# Patient Record
Sex: Male | Born: 1935 | Race: White | Hispanic: No | Marital: Married | Smoking: Never smoker
Health system: Southern US, Community
[De-identification: ages and names within clinical notes are randomized; demographics above are authoritative.]

## PROBLEM LIST (undated history)

## (undated) DIAGNOSIS — M545 Low back pain, unspecified: Secondary | ICD-10-CM

## (undated) DIAGNOSIS — M109 Gout, unspecified: Secondary | ICD-10-CM

## (undated) DIAGNOSIS — R413 Other amnesia: Secondary | ICD-10-CM

## (undated) DIAGNOSIS — Z973 Presence of spectacles and contact lenses: Secondary | ICD-10-CM

## (undated) DIAGNOSIS — I700 Atherosclerosis of aorta: Secondary | ICD-10-CM

## (undated) DIAGNOSIS — I10 Essential (primary) hypertension: Secondary | ICD-10-CM

## (undated) DIAGNOSIS — E559 Vitamin D deficiency, unspecified: Secondary | ICD-10-CM

## (undated) DIAGNOSIS — M1990 Unspecified osteoarthritis, unspecified site: Secondary | ICD-10-CM

## (undated) DIAGNOSIS — I82409 Acute embolism and thrombosis of unspecified deep veins of unspecified lower extremity: Secondary | ICD-10-CM

## (undated) DIAGNOSIS — R0602 Shortness of breath: Secondary | ICD-10-CM

## (undated) DIAGNOSIS — I2699 Other pulmonary embolism without acute cor pulmonale: Secondary | ICD-10-CM

## (undated) DIAGNOSIS — N189 Chronic kidney disease, unspecified: Secondary | ICD-10-CM

## (undated) HISTORY — DX: Atherosclerosis of aorta: I70.0

## (undated) HISTORY — PX: COLONOSCOPY: SHX174

## (undated) HISTORY — DX: Low back pain: M54.5

## (undated) HISTORY — PX: UPPER GI ENDOSCOPY: SHX6162

## (undated) HISTORY — DX: Chronic kidney disease, unspecified: N18.9

## (undated) HISTORY — PX: TONSILLECTOMY: SUR1361

## (undated) HISTORY — PX: VOCAL CORD LATERALIZATION, ENDOSCOPIC APPROACH W/ MLB: SHX2664

## (undated) HISTORY — DX: Vitamin D deficiency, unspecified: E55.9

## (undated) HISTORY — DX: Low back pain, unspecified: M54.50

---

## 1988-01-15 HISTORY — PX: TENDON REPAIR: SHX5111

## 1988-01-15 HISTORY — PX: BACK SURGERY: SHX140

## 1999-12-31 ENCOUNTER — Encounter: Payer: Self-pay | Admitting: Neurosurgery

## 1999-12-31 ENCOUNTER — Encounter: Admission: RE | Admit: 1999-12-31 | Discharge: 1999-12-31 | Payer: Self-pay | Admitting: Neurosurgery

## 2000-01-16 ENCOUNTER — Encounter: Payer: Self-pay | Admitting: Neurosurgery

## 2000-01-18 ENCOUNTER — Encounter: Payer: Self-pay | Admitting: Neurosurgery

## 2000-01-18 ENCOUNTER — Inpatient Hospital Stay (HOSPITAL_COMMUNITY): Admission: RE | Admit: 2000-01-18 | Discharge: 2000-01-20 | Payer: Self-pay | Admitting: Neurosurgery

## 2000-02-13 ENCOUNTER — Encounter: Admission: RE | Admit: 2000-02-13 | Discharge: 2000-03-19 | Payer: Self-pay | Admitting: Neurosurgery

## 2004-04-22 ENCOUNTER — Ambulatory Visit (HOSPITAL_COMMUNITY): Admission: RE | Admit: 2004-04-22 | Discharge: 2004-04-22 | Payer: Self-pay | Admitting: Family Medicine

## 2004-05-15 ENCOUNTER — Inpatient Hospital Stay (HOSPITAL_COMMUNITY): Admission: EM | Admit: 2004-05-15 | Discharge: 2004-05-21 | Payer: Self-pay | Admitting: Emergency Medicine

## 2004-05-16 ENCOUNTER — Encounter (INDEPENDENT_AMBULATORY_CARE_PROVIDER_SITE_OTHER): Payer: Self-pay | Admitting: *Deleted

## 2004-08-08 ENCOUNTER — Ambulatory Visit (HOSPITAL_COMMUNITY): Admission: RE | Admit: 2004-08-08 | Discharge: 2004-08-08 | Payer: Self-pay | Admitting: Family Medicine

## 2004-09-20 ENCOUNTER — Ambulatory Visit (HOSPITAL_COMMUNITY): Admission: RE | Admit: 2004-09-20 | Discharge: 2004-09-20 | Payer: Self-pay | Admitting: Urology

## 2005-10-21 ENCOUNTER — Ambulatory Visit: Payer: Self-pay | Admitting: Gastroenterology

## 2005-12-02 ENCOUNTER — Ambulatory Visit: Payer: Self-pay | Admitting: Gastroenterology

## 2005-12-12 ENCOUNTER — Ambulatory Visit: Payer: Self-pay | Admitting: Gastroenterology

## 2006-01-14 HISTORY — PX: UMBILICAL HERNIA REPAIR: SHX196

## 2006-01-14 HISTORY — PX: CHOLECYSTECTOMY: SHX55

## 2006-01-22 ENCOUNTER — Ambulatory Visit (HOSPITAL_COMMUNITY): Admission: RE | Admit: 2006-01-22 | Discharge: 2006-01-23 | Payer: Self-pay | Admitting: Surgery

## 2006-01-22 ENCOUNTER — Encounter (INDEPENDENT_AMBULATORY_CARE_PROVIDER_SITE_OTHER): Payer: Self-pay | Admitting: Specialist

## 2006-05-07 ENCOUNTER — Ambulatory Visit (HOSPITAL_COMMUNITY): Admission: RE | Admit: 2006-05-07 | Discharge: 2006-05-07 | Payer: Self-pay | Admitting: Family Medicine

## 2006-07-14 ENCOUNTER — Inpatient Hospital Stay (HOSPITAL_COMMUNITY): Admission: EM | Admit: 2006-07-14 | Discharge: 2006-07-19 | Payer: Self-pay | Admitting: Emergency Medicine

## 2006-07-14 ENCOUNTER — Ambulatory Visit: Payer: Self-pay | Admitting: Vascular Surgery

## 2006-07-31 ENCOUNTER — Ambulatory Visit: Payer: Self-pay | Admitting: *Deleted

## 2006-09-06 IMAGING — CR DG CHEST 1V PORT
1 series · 1 of 1 positions shown · non-contrast
Comparison: None.

CLINICAL DATA: Shortness of breath.

PORTABLE CHEST - 1 VIEW  [DATE]/[id number] hours:

[view not recorded]
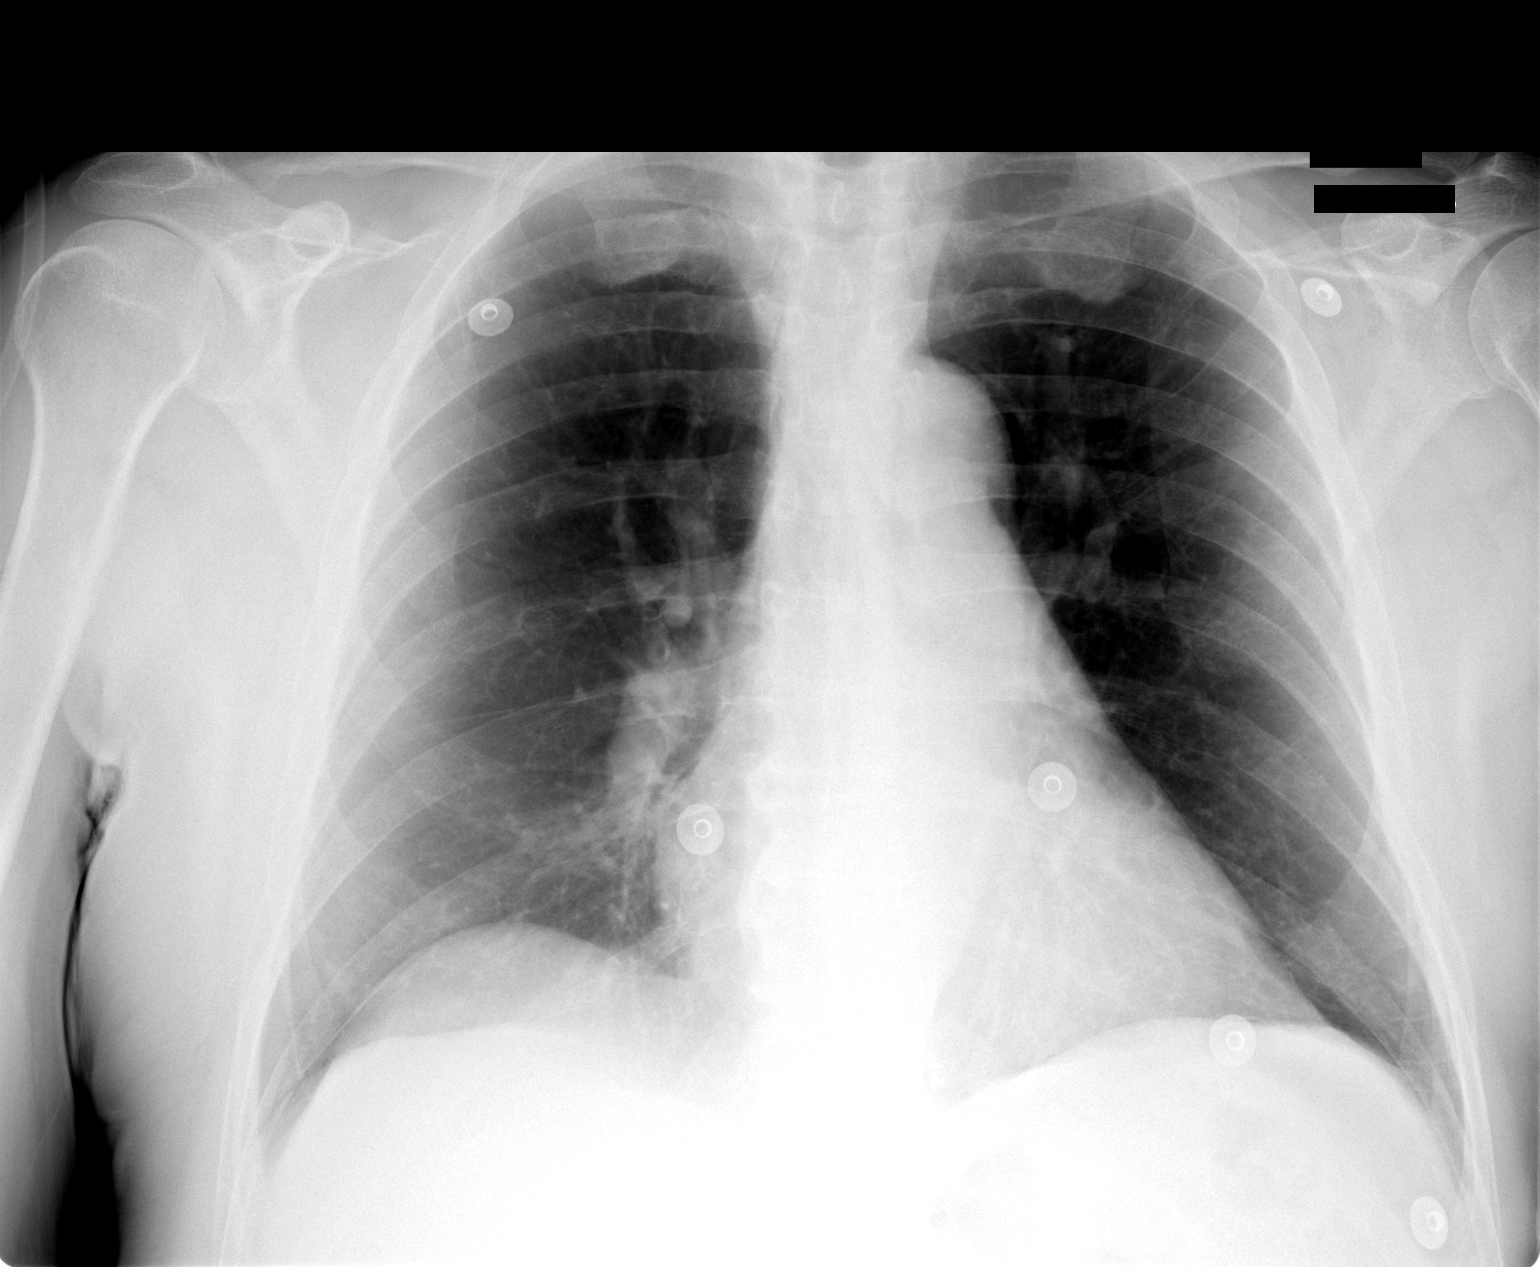

[1 of 1 positions shown; findings below may reference images not displayed]

FINDINGS: The heart size is normal for the AP portable technique. The thoracic
aorta is mildly tortuous. The hilar and mediastinal contours are otherwise
unremarkable. The lungs appear clear.
IMPRESSION: No evidence of acute disease.

## 2009-12-21 ENCOUNTER — Encounter
Admission: RE | Admit: 2009-12-21 | Discharge: 2009-12-21 | Payer: Self-pay | Source: Home / Self Care | Attending: Diagnostic Neuroimaging | Admitting: Diagnostic Neuroimaging

## 2010-03-29 ENCOUNTER — Emergency Department (HOSPITAL_BASED_OUTPATIENT_CLINIC_OR_DEPARTMENT_OTHER)
Admission: EM | Admit: 2010-03-29 | Discharge: 2010-03-29 | Disposition: A | Discharge status: Home / Self Care | Payer: Medicare Other | Attending: Emergency Medicine | Admitting: Emergency Medicine

## 2010-03-29 ENCOUNTER — Telehealth: Payer: Self-pay | Admitting: Gastroenterology

## 2010-03-29 ENCOUNTER — Emergency Department (INDEPENDENT_AMBULATORY_CARE_PROVIDER_SITE_OTHER): Payer: Medicare Other

## 2010-03-29 DIAGNOSIS — Z79899 Other long term (current) drug therapy: Secondary | ICD-10-CM | POA: Insufficient documentation

## 2010-03-29 DIAGNOSIS — R42 Dizziness and giddiness: Secondary | ICD-10-CM

## 2010-03-29 DIAGNOSIS — R55 Syncope and collapse: Secondary | ICD-10-CM

## 2010-03-29 DIAGNOSIS — R197 Diarrhea, unspecified: Secondary | ICD-10-CM | POA: Insufficient documentation

## 2010-03-29 DIAGNOSIS — I10 Essential (primary) hypertension: Secondary | ICD-10-CM | POA: Insufficient documentation

## 2010-03-29 LAB — DIFFERENTIAL
Basophils Absolute: 0 10*3/uL (ref 0.0–0.1)
Basophils Relative: 0 % (ref 0–1)
Eosinophils Absolute: 0 10*3/uL (ref 0.0–0.7)
Eosinophils Relative: 0 % (ref 0–5)
Lymphocytes Relative: 6 % — ABNORMAL LOW (ref 12–46)
Lymphs Abs: 0.6 10*3/uL — ABNORMAL LOW (ref 0.7–4.0)
Monocytes Absolute: 0.6 10*3/uL (ref 0.1–1.0)
Monocytes Relative: 6 % (ref 3–12)
Neutro Abs: 9 10*3/uL — ABNORMAL HIGH (ref 1.7–7.7)
Neutrophils Relative %: 88 % — ABNORMAL HIGH (ref 43–77)

## 2010-03-29 LAB — URINALYSIS, ROUTINE W REFLEX MICROSCOPIC
Bilirubin Urine: NEGATIVE
Glucose, UA: NEGATIVE mg/dL
Ketones, ur: NEGATIVE mg/dL
Leukocytes, UA: NEGATIVE
Nitrite: NEGATIVE
Protein, ur: NEGATIVE mg/dL
Specific Gravity, Urine: 1.012 (ref 1.005–1.030)
Urobilinogen, UA: 0.2 mg/dL (ref 0.0–1.0)
pH: 6 (ref 5.0–8.0)

## 2010-03-29 LAB — CBC
HCT: 32.6 % — ABNORMAL LOW (ref 39.0–52.0)
Hemoglobin: 11.4 g/dL — ABNORMAL LOW (ref 13.0–17.0)
MCH: 33.2 pg (ref 26.0–34.0)
MCHC: 35 g/dL (ref 30.0–36.0)
MCV: 95 fL (ref 78.0–100.0)
Platelets: 173 10*3/uL (ref 150–400)
RBC: 3.43 MIL/uL — ABNORMAL LOW (ref 4.22–5.81)
RDW: 12.9 % (ref 11.5–15.5)
WBC: 10.2 10*3/uL (ref 4.0–10.5)

## 2010-03-29 LAB — POCT CARDIAC MARKERS
CKMB, poc: 1 ng/mL (ref 1.0–8.0)
Myoglobin, poc: >500 ng/mL (ref 12–200)
Troponin i, poc: <0.05 ng/mL (ref 0.00–0.09)

## 2010-03-29 LAB — URINE MICROSCOPIC-ADD ON

## 2010-03-29 LAB — APTT: aPTT: 34 seconds (ref 24–37)

## 2010-03-29 LAB — PROTIME-INR
INR: 2.4 — ABNORMAL HIGH (ref 0.00–1.49)
Prothrombin Time: 26.3 seconds — ABNORMAL HIGH (ref 11.6–15.2)

## 2010-03-30 ENCOUNTER — Ambulatory Visit: Payer: Self-pay | Admitting: Physician Assistant

## 2010-03-31 ENCOUNTER — Inpatient Hospital Stay (HOSPITAL_COMMUNITY)
Admission: AD | Admit: 2010-03-31 | Discharge: 2010-04-04 | DRG: 683 | Disposition: A | Discharge status: Home Health | Payer: Medicare Other | Source: Other Acute Inpatient Hospital | Attending: Internal Medicine | Admitting: Internal Medicine

## 2010-03-31 ENCOUNTER — Emergency Department (HOSPITAL_BASED_OUTPATIENT_CLINIC_OR_DEPARTMENT_OTHER)
Admission: EM | Admit: 2010-03-31 | Discharge: 2010-03-31 | Disposition: A | Discharge status: Other Acute Inpatient Hospital | Payer: Medicare Other | Source: Home / Self Care | Attending: Emergency Medicine | Admitting: Emergency Medicine

## 2010-03-31 ENCOUNTER — Emergency Department (INDEPENDENT_AMBULATORY_CARE_PROVIDER_SITE_OTHER): Payer: Medicare Other

## 2010-03-31 DIAGNOSIS — R55 Syncope and collapse: Secondary | ICD-10-CM

## 2010-03-31 DIAGNOSIS — Z86711 Personal history of pulmonary embolism: Secondary | ICD-10-CM

## 2010-03-31 DIAGNOSIS — D649 Anemia, unspecified: Secondary | ICD-10-CM | POA: Diagnosis present

## 2010-03-31 DIAGNOSIS — N183 Chronic kidney disease, stage 3 unspecified: Secondary | ICD-10-CM | POA: Diagnosis present

## 2010-03-31 DIAGNOSIS — Z86718 Personal history of other venous thrombosis and embolism: Secondary | ICD-10-CM

## 2010-03-31 DIAGNOSIS — Z79899 Other long term (current) drug therapy: Secondary | ICD-10-CM | POA: Insufficient documentation

## 2010-03-31 DIAGNOSIS — E785 Hyperlipidemia, unspecified: Secondary | ICD-10-CM | POA: Diagnosis present

## 2010-03-31 DIAGNOSIS — R197 Diarrhea, unspecified: Secondary | ICD-10-CM | POA: Insufficient documentation

## 2010-03-31 DIAGNOSIS — Z7901 Long term (current) use of anticoagulants: Secondary | ICD-10-CM

## 2010-03-31 DIAGNOSIS — A088 Other specified intestinal infections: Secondary | ICD-10-CM | POA: Diagnosis present

## 2010-03-31 DIAGNOSIS — I10 Essential (primary) hypertension: Secondary | ICD-10-CM | POA: Insufficient documentation

## 2010-03-31 DIAGNOSIS — E872 Acidosis, unspecified: Secondary | ICD-10-CM | POA: Diagnosis present

## 2010-03-31 DIAGNOSIS — W19XXXA Unspecified fall, initial encounter: Secondary | ICD-10-CM

## 2010-03-31 DIAGNOSIS — M79609 Pain in unspecified limb: Secondary | ICD-10-CM

## 2010-03-31 DIAGNOSIS — I129 Hypertensive chronic kidney disease with stage 1 through stage 4 chronic kidney disease, or unspecified chronic kidney disease: Secondary | ICD-10-CM | POA: Diagnosis present

## 2010-03-31 DIAGNOSIS — N179 Acute kidney failure, unspecified: Principal | ICD-10-CM | POA: Diagnosis present

## 2010-03-31 DIAGNOSIS — W06XXXA Fall from bed, initial encounter: Secondary | ICD-10-CM | POA: Insufficient documentation

## 2010-03-31 DIAGNOSIS — Y92009 Unspecified place in unspecified non-institutional (private) residence as the place of occurrence of the external cause: Secondary | ICD-10-CM | POA: Insufficient documentation

## 2010-03-31 DIAGNOSIS — IMO0002 Reserved for concepts with insufficient information to code with codable children: Secondary | ICD-10-CM | POA: Insufficient documentation

## 2010-03-31 DIAGNOSIS — N289 Disorder of kidney and ureter, unspecified: Secondary | ICD-10-CM | POA: Insufficient documentation

## 2010-03-31 LAB — CBC
HCT: 29.5 % — ABNORMAL LOW (ref 39.0–52.0)
Hemoglobin: 10.3 g/dL — ABNORMAL LOW (ref 13.0–17.0)
MCH: 33.3 pg (ref 26.0–34.0)
MCHC: 34.9 g/dL (ref 30.0–36.0)
MCV: 95.5 fL (ref 78.0–100.0)
Platelets: 148 10*3/uL — ABNORMAL LOW (ref 150–400)
RBC: 3.09 MIL/uL — ABNORMAL LOW (ref 4.22–5.81)
RDW: 12.8 % (ref 11.5–15.5)
WBC: 5.9 10*3/uL (ref 4.0–10.5)

## 2010-03-31 LAB — PROTIME-INR
INR: 2.68 — ABNORMAL HIGH (ref 0.00–1.49)
INR: 3.1 — ABNORMAL HIGH (ref 0.00–1.49)
Prothrombin Time: 28.6 seconds — ABNORMAL HIGH (ref 11.6–15.2)
Prothrombin Time: 32 seconds — ABNORMAL HIGH (ref 11.6–15.2)

## 2010-03-31 LAB — BASIC METABOLIC PANEL
BUN: 35 mg/dL — ABNORMAL HIGH (ref 6–23)
CO2: 19 mEq/L (ref 19–32)
Calcium: 8.3 mg/dL — ABNORMAL LOW (ref 8.4–10.5)
Chloride: 101 mEq/L (ref 96–112)
Creatinine, Ser: 2.7 mg/dL — ABNORMAL HIGH (ref 0.4–1.5)
GFR calc Af Amer: 28 mL/min — ABNORMAL LOW (ref >60)
GFR calc non Af Amer: 23 mL/min — ABNORMAL LOW (ref >60)
Glucose, Bld: 103 mg/dL — ABNORMAL HIGH (ref 70–99)
Potassium: 4.1 mEq/L (ref 3.5–5.1)
Sodium: 130 mEq/L — ABNORMAL LOW (ref 135–145)

## 2010-03-31 LAB — CARDIAC PANEL(CRET KIN+CKTOT+MB+TROPI)
CK, MB: 5 ng/mL — ABNORMAL HIGH (ref 0.3–4.0)
CK, MB: 5 ng/mL — ABNORMAL HIGH (ref 0.3–4.0)
CK, MB: 5.2 ng/mL — ABNORMAL HIGH (ref 0.3–4.0)
Relative Index: 0.6 (ref 0.0–2.5)
Relative Index: 0.6 (ref 0.0–2.5)
Relative Index: 0.6 (ref 0.0–2.5)
Total CK: 839 U/L — ABNORMAL HIGH (ref 7–232)
Total CK: 858 U/L — ABNORMAL HIGH (ref 7–232)
Total CK: 915 U/L — ABNORMAL HIGH (ref 7–232)
Troponin I: 0.02 ng/mL (ref 0.00–0.06)
Troponin I: 0.01 ng/mL (ref 0.00–0.06)
Troponin I: 0.01 ng/mL (ref 0.00–0.06)

## 2010-03-31 LAB — DIFFERENTIAL
Basophils Absolute: 0 10*3/uL (ref 0.0–0.1)
Basophils Relative: 0 % (ref 0–1)
Eosinophils Absolute: 0 10*3/uL (ref 0.0–0.7)
Eosinophils Relative: 0 % (ref 0–5)
Lymphocytes Relative: 9 % — ABNORMAL LOW (ref 12–46)
Lymphs Abs: 0.5 10*3/uL — ABNORMAL LOW (ref 0.7–4.0)
Monocytes Absolute: 0.7 10*3/uL (ref 0.1–1.0)
Monocytes Relative: 12 % (ref 3–12)
Neutro Abs: 4.6 10*3/uL (ref 1.7–7.7)
Neutrophils Relative %: 79 % — ABNORMAL HIGH (ref 43–77)

## 2010-03-31 LAB — POCT CARDIAC MARKERS
CKMB, poc: 1.1 ng/mL (ref 1.0–8.0)
Myoglobin, poc: >500 ng/mL (ref 12–200)
Troponin i, poc: <0.05 ng/mL (ref 0.00–0.09)

## 2010-03-31 LAB — CLOSTRIDIUM DIFFICILE BY PCR: Toxigenic C. Difficile by PCR: NEGATIVE

## 2010-04-01 LAB — PROTIME-INR
INR: 3.55 — ABNORMAL HIGH (ref 0.00–1.49)
Prothrombin Time: 35.5 seconds — ABNORMAL HIGH (ref 11.6–15.2)

## 2010-04-01 LAB — BASIC METABOLIC PANEL
BUN: 25 mg/dL — ABNORMAL HIGH (ref 6–23)
CO2: 18 mEq/L — ABNORMAL LOW (ref 19–32)
Calcium: 8 mg/dL — ABNORMAL LOW (ref 8.4–10.5)
Chloride: 109 mEq/L (ref 96–112)
Creatinine, Ser: 2.21 mg/dL — ABNORMAL HIGH (ref 0.4–1.5)
GFR calc Af Amer: 35 mL/min — ABNORMAL LOW (ref >60)
GFR calc non Af Amer: 29 mL/min — ABNORMAL LOW (ref >60)
Glucose, Bld: 86 mg/dL (ref 70–99)
Potassium: 3.9 mEq/L (ref 3.5–5.1)
Sodium: 135 mEq/L (ref 135–145)

## 2010-04-01 LAB — CBC
HCT: 27.2 % — ABNORMAL LOW (ref 39.0–52.0)
Hemoglobin: 9.3 g/dL — ABNORMAL LOW (ref 13.0–17.0)
MCH: 32.5 pg (ref 26.0–34.0)
MCHC: 34.2 g/dL (ref 30.0–36.0)
MCV: 95.1 fL (ref 78.0–100.0)
Platelets: 156 10*3/uL (ref 150–400)
RBC: 2.86 MIL/uL — ABNORMAL LOW (ref 4.22–5.81)
RDW: 13.8 % (ref 11.5–15.5)
WBC: 5.3 10*3/uL (ref 4.0–10.5)

## 2010-04-01 LAB — CARDIAC PANEL(CRET KIN+CKTOT+MB+TROPI)
CK, MB: 3.8 ng/mL (ref 0.3–4.0)
Relative Index: 0.5 (ref 0.0–2.5)
Total CK: 825 U/L — ABNORMAL HIGH (ref 7–232)
Troponin I: 0.02 ng/mL (ref 0.00–0.06)

## 2010-04-02 ENCOUNTER — Inpatient Hospital Stay (HOSPITAL_COMMUNITY): Payer: Medicare Other

## 2010-04-02 LAB — GIARDIA/CRYPTOSPORIDIUM SCREEN(EIA)
Cryptosporidium Screen (EIA): NEGATIVE
Giardia Screen - EIA: NEGATIVE

## 2010-04-02 LAB — BASIC METABOLIC PANEL
BUN: 19 mg/dL (ref 6–23)
CO2: 18 mEq/L — ABNORMAL LOW (ref 19–32)
Calcium: 7.9 mg/dL — ABNORMAL LOW (ref 8.4–10.5)
Chloride: 111 mEq/L (ref 96–112)
Creatinine, Ser: 1.91 mg/dL — ABNORMAL HIGH (ref 0.4–1.5)
GFR calc Af Amer: 41 mL/min — ABNORMAL LOW (ref >60)
GFR calc non Af Amer: 34 mL/min — ABNORMAL LOW (ref >60)
Glucose, Bld: 95 mg/dL (ref 70–99)
Potassium: 3.7 mEq/L (ref 3.5–5.1)
Sodium: 134 mEq/L — ABNORMAL LOW (ref 135–145)

## 2010-04-02 LAB — PROTIME-INR
INR: 3.42 — ABNORMAL HIGH (ref 0.00–1.49)
Prothrombin Time: 34.5 seconds — ABNORMAL HIGH (ref 11.6–15.2)

## 2010-04-03 LAB — BASIC METABOLIC PANEL
BUN: 16 mg/dL (ref 6–23)
CO2: 20 mEq/L (ref 19–32)
Calcium: 8.1 mg/dL — ABNORMAL LOW (ref 8.4–10.5)
Chloride: 113 mEq/L — ABNORMAL HIGH (ref 96–112)
Creatinine, Ser: 1.78 mg/dL — ABNORMAL HIGH (ref 0.4–1.5)
GFR calc Af Amer: 45 mL/min — ABNORMAL LOW (ref >60)
GFR calc non Af Amer: 37 mL/min — ABNORMAL LOW (ref >60)
Glucose, Bld: 90 mg/dL (ref 70–99)
Potassium: 4 mEq/L (ref 3.5–5.1)
Sodium: 137 mEq/L (ref 135–145)

## 2010-04-03 LAB — PROTIME-INR
INR: 3.11 — ABNORMAL HIGH (ref 0.00–1.49)
Prothrombin Time: 32.1 seconds — ABNORMAL HIGH (ref 11.6–15.2)

## 2010-04-03 NOTE — Progress Notes (Signed)
Summary: Triage  Phone Note Call from Patient   Caller: Angie @ [facility] family practice Call For: Dr. Sharlett Iles Reason for Call: Talk to Nurse Summary of Call: Angie at [facility] Dr. [facility] office is calling to get this patient in ASAP for diarrhea and a diverticulits flare up, you can call Angie to get further info and to schedule @ [phone] Initial call taken by: Martinique Johnson,  March 29, 2010 9:12 AM  Follow-up for Phone Call        Spoke with Angie @ Dr [facility] K[facility] office who stated she only reported what patient told her. Called patient who stated he has had  diarrhea for a couple of weeks, but yesterday and today, the stools are almost all water. He reports blood sometimes, but he has hemorrhoids. He denies pain or a fever and he's on a regular diet. Patient will come in tomorrow to see Nicoletta Ba [facility] . Follow-up by: Shella Maxim RN,  March 29, 2010 11:40 AM

## 2010-04-03 NOTE — Procedures (Signed)
Summary: ENDOSCOPY   EGD  Procedure date:  12/02/2005  Findings:      Location: [place]   Patient Name: Glenn Hicks, Glenn Hicks MRN:  Procedure Procedures: Panendoscopy (EGD) CPT: A5739879.    with esophageal dilation. CPT: Z1858338.  Personnel: Endoscopist: [name], MD.  Exam Location: Exam performed in Outpatient Clinic. Outpatient  Patient Consent: Procedure, Alternatives, Risks and Benefits discussed, consent obtained, from patient. Consent was obtained by the RN.  Indications Symptoms: Dysphagia. Reflux symptoms for 6-10 yrs, occurring <3 times/wk.  History  Current Medications: Patient is taking a non-steroidal medication. Patient is not currently taking Coumadin.  Medical/Surgical History: Hypertension, Gout,  Pre-Exam Physical: Performed Dec 02, 2005  Cardio-pulmonary exam, Abdominal exam, Extremity exam, Mental status exam WNL.  Comments: Pt. history reviewed/updated, physical exam performed prior to initiation of sedation?yes Exam Exam Info: Maximum depth of insertion Duodenum, intended Duodenum. Patient position: on left side. Duration of exam: 10 minutes. Vocal cords visualized. Gastric retroflexion performed. Images taken. ASA Classification: II. Tolerance: excellent.  Sedation Meds: Patient assessed and found to be appropriate for moderate (conscious) sedation. Cetacaine Spray 2 sprays given aerosolized.  Monitoring: BP and pulse monitoring done. Oximetry used. Supplemental O2 given at 2 Liters.  Instrument(s): GIF 160. Serial [id number].   Findings - Normal: Proximal Esophagus to Distal Esophagus. Not Seen: Tumor. Barrett's esophagus. Mucosal abnormality. Foreign body. Varices.  - STRICTURE / STENOSIS: Distal Esophagus.  Constriction: partial. Lumen diameter is 15 mm. ICD9: Esophageal Stricture: 530.3.  - Dilation: Distal Esophagus. Maloney dilator used, Diameter: 56 F, No Resistance, No Heme present on extraction. 1  total  dilators used. Patient tolerance excellent. Outcome: successful.  - Normal: Fundus to Antrum. Tumor. Ulcer. Mucosal abnormality. AVM's. Foreign body. Polyp. Varices. Biopsy/Normal taken. Comments: CLO Bx. done.  - Normal: Duodenal Bulb to Duodenal 2nd Portion. Not Seen: Ulcer. Mucosal abnormality.   Assessment  Diagnoses: 530.3: Esophageal Stricture. GERD.   Events  Unplanned Intervention: No unplanned interventions were required.  Plans Medication(s): Await pathology.  Patient Education: Patient given standard instructions for: Reflux.  Disposition: After procedure patient sent to recovery. After recovery patient sent home.  Comments: Upper abdominal ultrasound...Marland Kitchen.  cc: Dr Briscoe Deutscher This report was created from the original endoscopy report, which was reviewed and signed by the above listed endoscopist.

## 2010-04-03 NOTE — Procedures (Signed)
Summary: COLONOSCOPY   Colonoscopy  Procedure date:  12/02/2005  Findings:      Location:  [facility]   Patient Name: Glenn Hicks, Glenn Hicks MRN:  Procedure Procedures: Colonoscopy CPT: 3082113231.  Personnel: Endoscopist: [name], MD.  Exam Location: Exam performed in Outpatient Clinic. Outpatient  Patient Consent: Procedure, Alternatives, Risks and Benefits discussed, consent obtained, from patient. Consent was obtained by the RN.  Indications Symptoms: Abdominal pain / bloating. Change in bowel habits.  Surveillance of: Adenomatous Polyp(s).  History  Current Medications: Patient is taking an non-steroidal medication. Patient is not currently taking Coumadin.  Medical/ Surgical History: Hypertension, Gout,  Pre-Exam Physical: Performed Dec 02, 2005. Cardio-pulmonary exam, Rectal exam, Abdominal exam, Extremity exam, Mental status exam WNL.  Comments: Pt. history reviewed/updated, physical exam performed prior to initiation of sedation?yes Exam Exam: Extent of exam reached: Cecum, extent intended: Cecum.  The cecum was identified by appendiceal orifice and IC valve. Patient position: on left side. Time to Cecum: 00:11:56. Time for Withdrawl: 00:05:12. Colon retroflexion performed. Images taken. ASA Classification: II. Tolerance: excellent.  Monitoring: Pulse and BP monitoring, Oximetry used. Supplemental O2 given. at 2 Liters.  Colon Prep Used Golytely for colon prep. Prep results: fair, adequate exam.  Sedation Meds: Patient assessed and found to be appropriate for moderate (conscious) sedation. Fentanyl 50 mcg. given IV. Versed 4.5 mg. given IV.  Instrument(s): CF 140L. Serial [id number].  Findings - DIVERTICULOSIS: Descending Colon to Sigmoid Colon. Not bleeding. ICD9: Diverticulosis, Colon: 562.10. Comments: Complex wide mouthed tics noted....  - NORMAL EXAM: Cecum to Rectum. Not Seen: Polyps. AVM's. Colitis. Tumors. Melanosis.  Crohn's.  - HEMORRHOIDS: Internal. Size: Medium. Not bleeding. Not thrombosed. ICD9: Hemorrhoids, Internal: 455.0.   Assessment  Diagnoses: 562.10: Diverticulosis, Colon.  455.0: Hemorrhoids, Internal.   Events  Unplanned Interventions: No intervention was required.  Plans Medication Plan: Continue current medications.  Patient Education: Patient given standard instructions for: Diverticulosis.  Disposition: After procedure patient sent to recovery. After recovery patient sent home.  Scheduling/Referral: EGD, to [employer]. Sharlett Iles, MD, on Dec 02, 2005.    cc: Dr Briscoe Deutscher This report was created from the original endoscopy report, which was reviewed and signed by the above listed endoscopist.

## 2010-04-04 LAB — BASIC METABOLIC PANEL
BUN: 15 mg/dL (ref 6–23)
CO2: 20 mEq/L (ref 19–32)
Calcium: 8.1 mg/dL — ABNORMAL LOW (ref 8.4–10.5)
Chloride: 111 mEq/L (ref 96–112)
Creatinine, Ser: 1.8 mg/dL — ABNORMAL HIGH (ref 0.4–1.5)
GFR calc Af Amer: 44 mL/min — ABNORMAL LOW (ref >60)
GFR calc non Af Amer: 37 mL/min — ABNORMAL LOW (ref >60)
Glucose, Bld: 89 mg/dL (ref 70–99)
Potassium: 4.2 mEq/L (ref 3.5–5.1)
Sodium: 136 mEq/L (ref 135–145)

## 2010-04-04 LAB — PROTIME-INR
INR: 2.51 — ABNORMAL HIGH (ref 0.00–1.49)
Prothrombin Time: 27.2 seconds — ABNORMAL HIGH (ref 11.6–15.2)

## 2010-04-05 LAB — CK ISOENZYMES
CK-BB: 0 %
CK-MB: 1.2 % (ref <5)
CK-MM: 98.8 % (ref 95–100)
Total CK: 292 U/L — ABNORMAL HIGH (ref 7–232)

## 2010-04-05 NOTE — H&P (Signed)
Glenn Hicks, Glenn Hicks              ACCOUNT NO.:  [id number]  MEDICAL RECORD NO.:  [id number]           PATIENT TYPE:  I  LOCATION:  [id number]                         FACILITY:  [place]  PHYSICIAN:  Oren Binet, MD    DATE OF BIRTH:  1928/02/01  DATE OF ADMISSION:  03/31/2010 DATE OF DISCHARGE:                             HISTORY & PHYSICAL   PRIMARY CARE PRACTITIONER:  Robert L. Maceo Pro, MD  CHIEF COMPLAINT:  Syncope and diarrhea.  HISTORY OF PRESENT ILLNESS:  The patient is a 77 year old Caucasian male who has a prior past medical history of hypertension, dyslipidemia, and venous thromboembolism, on chronic Coumadin therapy comes in with the above-mentioned complaints.  Per patient since Wednesday he has been having profuse loose watery diarrhea.  Thursday night he fell twice and presented to the ED and was then discharged home.  Last night he again fell and this time it was a syncopal event.  All the falls occurred when the patient was attempting to get out of bed.  He denies any chest pain or shortness of breath or palpitations.  He denies any dizziness as well.  The patient does not have nausea or vomiting.  The patient continues to have at least 5-6 episodes of loose watery stools daily. He was evaluated at the [place] and then transferred here to [facility] for further evaluation and treatment.  ALLERGIES:  None.  PAST MEDICAL HISTORY: 1. 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 2. Hypertension. 3. Gout. 4. Dyslipidemia.  PAST SURGICAL HISTORY:  The patient has had back surgery.  FAMILY HISTORY:  Noncontributory to the current condition.  MEDICATIONS:  At home include; 1. Allopurinol 300 mg 1 tablet p.o. daily. 2. Aspirin 81 mg 1 tablet p.o. daily. 3. Colchicine 0.6 mg 1 tablet daily. 4. Lasix 20 mg 1 tablet daily. 5. Losartan 50 mg 1 tablet a day. 6. Warfarin 3 mg every day except  1.5 mg on Mondays and Fridays. 7. Lovastatin 20 mg 1 tablet p.o. nightly.  SOCIAL HISTORY:  The patient lives with his wife.  He denies any toxic habits.  The patient is very active and plays 18 hole golf rounds at least twice week.  REVIEW OF SYSTEMS:  A detailed review of 12 systems was done and these are negative except for the one's mentioned in the HPI.  PHYSICAL EXAM:  VITAL SIGNS:  Afebrile, pulse of 78, respirations of 20, blood pressure 129/74, saturation of 97% on room air. GENERAL:  Awake, alert, does not appear to be in any distress. HEENT:  Atraumatic, normocephalic.  Pupils are equally reactive to light and accommodation.  Oral mucosa looks very dry. NECK:  Supple.  No JVD. CHEST:  Bilaterally clear to auscultation. CARDIOVASCULAR:  Heart sounds are regular.  No murmurs heard. ABDOMEN:  Soft, nontender, nondistended. EXTREMITIES:  No edema. NEUROLOGY:  The patient is alert, oriented x3, and has no focal neurological deficits. SKIN:  The patient does have evidence of bruising on his left forehead and also on his left shin region without any tenderness.  LABORATORY DATA: 1. CBC shows a WBC of 5.9, hemoglobin of 10.3 and a platelet count of     148. 2. INR is 2.68. 3. Chemistry shows a sodium of 130, potassium of 4.1, chloride of 101,     bicarb of 19, glucose of 103, BUN of 35, creatinine of 2.7 and a     calcium of 8.3. 4. Point-of-care cardiac markers shows a troponin less than 0.05 and a     myoglobin of more than 500.  RADIOLOGICAL STUDIES: 1. CT of the head without contrast shows no acute intracranial     hemorrhage, acute infarct or mass lesion. 2. X-ray of the left ankle shows no evidence of acute fracture or     subluxation. 3. X-ray of the left tibia and fibula shows no evidence of acute     fracture or subluxation. 4. Left foot x-ray shows no acute fracture or subluxation. 5. EKG shows sinus rhythm.  ASSESSMENT: 1. Syncope.  This is probably  secondary to likely orthostatic     hypotension given his history of diarrhea, dehydration and renal     failure.  I doubt this is anything cardiac at this point in time,     however we would continue to telemonitoring to see and assess     clinical course. 2. Acute renal failure, likely prerenal given his history of diarrhea,     made worse by losartan and Lasix therapy. 3. Dehydration. 4. Coagulopathy, secondary to chronic Coumadin therapy for venous     thromboembolism. 5. History of hypertension, currently well controlled. 6. History of dyslipidemia.  PLAN: 1. The patient will be admitted to telemetry unit. 2. He will be placed on IV fluids at 100 mL an hour.  He will be     started on full liquids and will be advanced to heart-healthy diet     as tolerated. 3. We will hold his losartan and Lasix and we will put him on     amlodipine at 5 mg p.o. daily for his hypertension. 4. Coumadin will be continued to manage by the pharmacy. 5. We will send out his stool for studies including C. diff, gram     stain, ova and parasites.  However at this point it is thought that     diarrhea is more likely secondary to viral process than anything     else.  The patient does not have any history of any recent     antibiotic use or recent hospitalization, therefore C. diff at this     point is also considered unlikely. 6. DVT prophylaxis is not needed as the patient's INR is therapeutic. 7. Code status.  The patient is a full code. 8. Total time spent equals 45 minutes.     Oren Binet, MD     SG/MEDQ  D:  03/31/2010  T:  03/31/2010  Job:  [id number]  cc:   [name], M.D.  Electronically Signed by Oren Binet  on 04/05/2010 08:40:03 PM

## 2010-04-06 LAB — STOOL CULTURE

## 2010-04-27 NOTE — Discharge Summary (Signed)
Glenn Hicks, Glenn Hicks NO.:  [id number]  MEDICAL RECORD NO.:  [id number]           PATIENT TYPE:  I  LOCATION:  [id number]                         FACILITY:  [place]  PHYSICIAN:  Glenn Hicks, M.D.   DATE OF BIRTH:  1928-10-04  DATE OF ADMISSION:  03/31/2010 DATE OF DISCHARGE:  04/04/2010                              DISCHARGE SUMMARY   DISCHARGE DIAGNOSES: 1. Syncope secondary to orthostatic hypotension secondary to diarrhea. 2. Viral gastroenteritis with diarrhea resolved. 3. History of deep venous thrombosis and pulmonary embolism on     therapeutic Coumadin which is stable. 4. Hypertension with hypotension on admission, off Lasix but     continuing with ARB. 5. Gout stable. 6. Dyslipidemia. 7. Non-anion gap acidosis secondary to diarrhea resolved. 8. Acute renal failure improved and secondary to diarrhea. 9. Chronic kidney disease stage III with a GFR of 37 on discharge.  Of     note it was 23 on admission with a creatinine of 2.7. 10.Anemia.  His hemoglobin when last checked on March 18 was 9.3.  He     had a MCV of 95.  On March 15th, his hemoglobin was 11.4.  Without     any further signs of bleeding, this is my first day seeing Mr.     Hicks and again he feels well enough to go home, it would have     been nice if he had a hemoglobin check over the last 3 days.     Unfortunately that has not been done but again his blood pressures     are okay on the fluids.  DISCHARGE MEDICATIONS: 1. Imodium 2 mg by mouth as needed for every loose bowel movement up     to 8 doses daily. 2. Allopurinol 300 mg by mouth once a day. 3. Aspirin 81 mg by mouth once a day. 4. Colchicine 0.6 mg as needed for gout pain. 5. Losartan 50 mg by mouth once a day. 6. Coumadin 3 mg by mouth every day except for Mondays and Fridays     when he takes 1.5 mg. 7. His Lasix is on hold until he follows up with Dr. Maceo Hicks given his     presentation with hypotension,  diarrhea.  DISPOSITION AND FOLLOWUP:  Glenn Hicks is improved on discharge with a stable blood pressure with resolution of his diarrhea and he is feeling near back the baseline.  He will need to follow up with Dr. Maceo Hicks in 1 week.  He said he will make that appointment on his own.  With that appointment, please follow up on his blood pressure, especially his orthostatics.  Please also consider obtaining a basic metabolic panel to follow up on his renal function.  His creatinine on discharge is 1.8. Of note, I believe Glenn Hicks may have come in on lovastatin.  We did not check an LDL while he was in the hospital.  He is going to go home off lovastatin.  Now defer to resume with that medication at your discretion.  Dr. Maceo Hicks can also check a hemoglobin.  Of note his hemoglobin on discharge was 9.3 but  he was not showing any specific signs of bleeding.  His MCV is also 95, so please check a B12, RBC, folate, and TSH at your discretion.  PROCEDURES PERFORMED DURING THIS HOSPITALIZATION:  CT scan of the head, which was negative.  He also had a CT scan of the head incidentally on March 29, 2010 that was negative.  BRIEF ADMISSION HISTORY AND PHYSICAL:  Glenn Hicks is a 77 year old male with a history as previously stated who presented on March 17 after a syncopal event.  He had been having profuse watery diarrhea that just had been persisting 5-6 episodes a day and then while he was getting out of bed he passed out.  INITIAL LABS AND VITALS:  Afebrile with a pulse of 78, respiratory rate 20, blood pressure 129/74, O2 saturation 97% on room air.  White count 6, hemoglobin 10, platelet count 148,000.  INR 2.7.  Sodium 130, potassium 4.1, chloride 101, bicarb 19, BUN 35, creatinine 2.7, calcium 8.3, glucose 103.  For more detailed history and physical, please refer to the admission dictation by Dr. Sloan Leiter.  HOSPITAL COURSE FOLLOWED BY ISSUES: 1. Syncope.  This was secondary to diarrhea but  as a precaution given     that he was on Coumadin, workup was undertaken which included a 2D     echo that was negative.  He was kept on tele.  He did not have any     events.  His EF was 60%.  He was given hydration.  He was given as     needed Imodium.  He had his stool studies checked.  They all came     back negative including a negative C. diff, negative parasites     screen, and a negative stool culture.  He has been on IV fluids.     With gradual tapering of the IV fluids, he continues to be okay.     His blood pressure has improved.  His orthostatics have resolved.     On discharge, his blood pressure is 132/80.  Of note, he is not     feeling 100% back to normal.  He is feeling well enough to go home.     His wife is agreeable to this.  He does have family at home who can     help him.  So we will allow him to go home but off the Lasix. 2. Diarrhea.  Again this is likely viral.  Again would consider     following up a hemoglobin to make sure he does not continue his     ammonium and potentially a guaiac test.  If he does, then I would     see when he has had his last colonoscopy.  He may need another one     if his symptoms persist. 3. Coumadin level.  His Coumadin level did gradually increase while he     was in hospital to a high of 3.5 warranting some change, decrease     in his Coumadin but when he came in, his Coumadin level was normal     and his diet is back to normal, so I think going back on the same     dose of Coumadin would be prudent and again follow this up with Dr.     Maceo Hicks.  DISCHARGE LABS AND VITALS:  Temperature 98.1, heart rate 85, respiratory rate 16, blood pressure 132/80, O2 sat 96% on room air.  Sodium 136, potassium 4.2, chloride 111, bicarb  20, BUN 15, creatinine 1.8, glucose 89, calcium 8.1.  Stool cultures negative.  INR 2.5.  Length of this discharge was approximately 30 minutes     Glenn Hicks, M.D.     JC/MEDQ  D:  04/04/2010  T:   04/04/2010  Job:  [id number]  cc:   Glenn Hicks, M.D.  Electronically Signed by Glenn Hicks M.D. on 04/27/2010 11:34:14 AM

## 2010-05-29 NOTE — Procedures (Signed)
DUPLEX DEEP VENOUS EXAM - LOWER EXTREMITY   INDICATION:  Follow up known right lower extremity DVT.   HISTORY:  Edema:  No.  Trauma/Surgery:  No.  Pain:  No.  PE:  Yes.  Previous DVT:  Right lower extremity popliteal and calf vein DVT.  Anticoagulants:  Coumadin.  Other:   DUPLEX EXAM:                CFV   SFV   PopV  PTV    GSV                R  L  R  L  R  L  R   L  R  L  Thrombosis    0  0  0     0     +      0  Spontaneous   +  +  +     +     0      +  Phasic        +  +  +     +     0      +  Augmentation  +  +  +     +     0      +  Compressible  +  +  +     +     0      +  Competent     +  +  +     +     0      +   Legend:  + - yes  o - no  p - partial  D - decreased   IMPRESSION:  The right lower extremity deep venous system was imaged,  dopplered, and shows evidence of almost totally occluding sub-  acute/chronic DVT in the right posterior tibial vein.   All other imaged deep veins appear patent.   The left common femoral vein appears patent.    _____________________________  Rosetta Posner, M.D.   AS/MEDQ  D:  07/31/2006  T:  08/01/2006  Job:  [id number]   cc:   [name], M.D.

## 2010-05-29 NOTE — Consult Note (Signed)
Glenn Hicks, COUPLAND              ACCOUNT NO.:  [id number]   MEDICAL RECORD NO.:  [id number]          PATIENT TYPE:  INP   LOCATION:  [place]                         [facility]AC[facility]   PHYSICIAN:  Jessy Oto. Fields, MD  DATE OF BIRTH:  February 21, 1928   DATE OF CONSULTATION:  07/16/2006  DATE OF DISCHARGE:                                 CONSULTATION   REASON FOR CONSULTATION:  Deep vein thrombosis with pulmonary embolus.   HISTORY OF PRESENT ILLNESS:  The patient is a 77 year old male with  prior history of DVT in the right leg two years ago.  He had a pulmonary  embolus at that time.  He was treated with Coumadin for six months, then  this was discontinued.  He presented to the ER three days ago with  recurrent DVT in the same leg and a new pulmonary embolus.  He denies  family history of hypercoagulable state or personal history of  hypercoagulable state.  He denies recent history of immobility or recent  trauma.  He has no history of GI bleeding or other contraindication to  long-term anticoagulation.   PAST MEDICAL HISTORY:  1. Hypertension.  2. Gout.  3. Lumbar disk disease and has some chronic numbness in the posterior      aspect of his legs.  4. Previous back surgery.   PHYSICAL EXAMINATION:  EXTREMITIES:  He has 2+ femoral popliteal and  dorsalis pedis pulses bilaterally.  He has minimal edema, and this is  not asymmetric in either leg.   STUDIES:  Review of his Duplex scan shows a popliteal DVT in the right  leg.  This is a scan dated July 14, 2006.   LABORATORY DATA:  INR from July 2 is 1.3.  He has been on Coumadin since  June 30 .   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  I agree that this is not currently a failure of anticoagulation.  I  would place him on lifelong Coumadin as long as he has no  contraindications to this.  I do not believe an IVC filter is warranted  at this point.  However, if he has propagation of the thrombus  while on  Coumadin, or if he has recurrent pulmonary embolus or develops  contraindication to anticoagulation, then a filter would need to be  placed at that time.  He needs a follow up Duplex venous scan in two  weeks to make sure that he has not had further propagation of the clot  while on anticoagulation.  As far as his mobility is concerned, I told  him that it would be okay to do small activities such as get up and go  to the bathroom and things of that sort, but would have fairly limited  activity until he is fully anticoagulated.      Jessy Oto. Fields, MD  Electronically Signed     CEF/MEDQ  D:  07/16/2006  T:  07/16/2006  Job:  [id number]

## 2010-05-29 NOTE — H&P (Signed)
Glenn Hicks, Glenn Hicks              ACCOUNT NO.:  [id number]   MEDICAL RECORD NO.:  [id number]          PATIENT TYPE:  INP   LOCATION:  0101                         FACILITY:  [facility]   PHYSICIAN:  Annita Brod, M.D.DATE OF BIRTH:  [birth year]   DATE OF ADMISSION:  07/14/2006  DATE OF DISCHARGE:                              HISTORY & PHYSICAL   PRIMARY CARE PHYSICIAN:  Dr. [name].   CHIEF COMPLAINT:  Shortness of breath and chest pain.   HISTORY OF PRESENT ILLNESS:  The patient is a 77 year old white male  with past medical history of hypertension who previously had a blood  clot several years ago.  Because of that, the blood clot was never  determined.  The patient remained on Coumadin for approximately 3-6  months.  Since that time, he was taken off of it.  He has done well ever  since with no complaints.  Then, two days ago he went out for a walk  with his wife which he normally does, but this time he noticed he became  markedly dyspneic on exertion.  Symptoms persisted, and then he started  having episodes of chest pain later on described as sharp, and multiple  areas over across his chest.  He became concerned and came into the  [facility] today where he was evaluated.  It was felt very  suspicious that he probably had a pulmonary embolus, especially given  his previous history.  He came into the emergency room for further  evaluation.  D. dimer was found to be elevated at 3.05.  However, his  BUN and creatinine were elevated as well with a BUN of 35 and creatinine  of 2.26.  Therefore, a CT angiogram of the chest was not able to be  done.  The patient then underwent a VQ scan which showed multiple areas  of ventilation perfusion mismatched consistent with multiple pulmonary  emboli.  The patient was started on Lovenox therapy.  He was noted to be  saturating on 96% on room air.  After discussion with the ER, attending  was felt it was best for the patient  to come in and start on Lovenox and  Coumadin therapy.  Currently, the patient is doing well.  He denies any  headaches, vision changes, dysphagia, chest pain, palpitations,  shortness of breath, wheezing, coughing, no abdominal pain, no  hematuria, dysuria, constipation, diarrhea, focal numbness, weakness or  pain.  The patient cannot recall any recent long car or plane trips.  No  recent immobilization, illnesses which would wake him up, trauma to his  leg, or new medication.   PAST MEDICAL HISTORY:  1. Previous history of PE of undetermined cause.  2. Hypertension.  3. Gout.   MEDICATIONS:  1. Lisinopril/HCTZ.  2. Aspirin.  3. Folic acid.  4. Colchicine.   ALLERGIES:  No known drug allergies.   SOCIAL HISTORY:  Denies any tobacco, alcohol or drug use.   FAMILY HISTORY:  Noncontributory.   PHYSICAL EXAMINATION:  VITAL SIGNS:  On admission, temperature 98, heart  rate 107 now down to 86, blood  pressure 176/89 now down to 127/80,  respirations 20, O2 saturations 96% on room air.  GENERAL:  The patient is alert and oriented in no acute distress.  HEENT:  Normocephalic, atraumatic.  Mucous membranes moist.  No carotid  bruits.  HEART:  Regular rate and rhythm.  S1, S2.  LUNGS:  Clear to auscultation bilaterally.  Abdomen:  Soft, nontender, nondistended, positive bowel sounds.  EXTREMITIES:  No cyanosis, clubbing or edema.  Good 2+ pulses.  Negative  for Homan's sign.   LABORATORY DATA:  VQ scan is as per HPI.  Sodium 135, potassium 4.2,  chloride 105, bicarbonate 20, BUN 35, creatinine 2.26, glucose 105,  white count 9.6, H&H 12.1 and 34.9, MCV 94, platelets 212, 80% shift.  D. dimer is elevated at 3.05.  Chest x-ray shows signs of chronic  scarring, consistent with a perhaps old COPD.   ASSESSMENT/PLAN:  1. Pulmonary embolus, multiple.  Cause of this is still undetermined,      but likely, at this point, the patient will have to be on Coumadin      lifelong.  Will plan  to check a lower extremity Doppler as well as      put the patient on Coumadin and Lovenox.  Again, will have to      likely be on this for life long.  He has already been through this      process before, so we will continue.  2. Hypertension.  Holding his lisinopril for now given his renal      failure, and will gently hydrate.  3. Acute renal failure.  Cause of this may be likely from his blood      pressure medications, possibly from additional dehydration from his      pulmonary emboli.  We will gently hydrate and continue to follow      his labs.      Annita Brod, M.D.  Electronically Signed     SKK/MEDQ  D:  07/14/2006  T:  07/14/2006  Job:  [id number]   cc:   [name], M.D.  Fax: [phone]

## 2010-05-29 NOTE — Discharge Summary (Signed)
Glenn Hicks, Glenn Hicks              ACCOUNT NO.:  [id number]   MEDICAL RECORD NO.:  [id number]          PATIENT TYPE:  INP   LOCATION:  [id number]                         FACILITY:  [facility]   PHYSICIAN:  Sheila Oats, M.D.DATE OF BIRTH:  [birth year]   DATE OF ADMISSION:  07/14/2006  DATE OF DISCHARGE:  07/19/2006                               DISCHARGE SUMMARY   DISCHARGE DIAGNOSES:  1. Pulmonary emboli, multiple, recurrent.  Patient will require life-      long Coumadin.  2. Right lower extremity deep venous thrombosis.  3. Acute-on-chronic renal insufficiency.  Last creatinine prior to      discharge 1.79, improved from 2.26 on admission.  4. Hypertension.  5. History of gout.   PROCEDURES AND STUDIES:  1. VQ scan:  Multiple perfusion defects in the absence of ventilatory      or radiographic findings.  High probability for pulmonary embolic      disease.  2. Right lower extremity Doppler ultrasound:  Positive for DVT,      mobile, noted the right popliteal vein.   CONSULTATION:  Vascular surgery, Dr. Oneida Alar.   BRIEF HISTORY AND ADMISSION PHYSICAL EXAMINATION:  The patient is a 77-  year-old white male with above-listed medical problems including a  previous history of pulmonary embolus of undetermined cause who  presented with complaint of shortness of breath and chest pain.  He  reported that he had gone out for a walk two days prior with his wife  but became very dyspneic on exertion.  The symptoms persisted, so he  went to the [facility] walk-in clinic.  Given his previous history of PE it  was suspected that he had another one, so he was sent to the ER for  further evaluation and management.  In the ER he had a D-dimer which was  elevated at 3.05.  His BUN and creatinine were also elevated at 35 and  2.26, so he had a VQ scan done and, as stated above, it was positive for  multiple pulmonary emboli.  He was admitted for further evaluation.   Physical exam upon admission as per  Dr. [place] Pink was reviewed.  Temperature of 98, pulse 107 initially, decreased to 86, blood pressure  127/80, initially 176/89, O2 sats 96% on room air.  His physical exam  was reported to be within normal limits.  On the laboratory data his  sodium was 135, potassium 4.2, chloride 105, CO2 of 20, BUN 35,  creatinine 2.25, glucose 105.  White cell count of 9.6, hemoglobin of  12.1, hematocrit of 34.9.  His D-dimer 3.05.  Chest x-ray showed chronic  scarring with ?COPD changes.   HOSPITAL COURSE:  1. Multiple pulmonary emboli:  Upon admission patient was started on      anticoagulation with Lovenox and later Coumadin added.  His PT/INR      were monitored until his INR became therapeutic and the Lovenox and      Coumadin were overlapped.  On recheck his INR is still therapeutic      today at 2.7.  His dyspnea on exertion resolved while in  the      hospital and he has not had any chest pain.  He had lower extremity      Doppler ultrasound done, and the results are as stated above,      positive for DVT which was recorded to be mobile.  Following this      finding, vascular surgery was consulted and Dr. Oneida Alar saw the      patient and agreed that the patient did not require an IVC filter      at this time as he had not failed anticoagulation and did not have      any contraindications to anticoagulation.  He recommended, given      that the clot was mobile, to have a followup ultrasound scheduled      for the patient in two weeks and if he clot was propagating he      would require an IVC filter at that time.  The patient is to follow      up with Dr. Oneida Alar on June 17 at 1 p.m.  He is to follow up with      his primary care physician on July 21, 2006, for PT/INR recheck and      adjustment of his Coumadin dose as appropriate.  2. Right lower extremity deep venous thrombosis:  As discussed above.  3. Acute-on-chronic renal insufficiency:  As above, the patient's      creatinine upon  admission was noted to be elevated at 2.26, his      lisinopril/HCTZ was held while in the hospital, and his blood      pressures monitored and remained stable.  Following this his      creatinine improved to 1.79 prior to discharge.  He is to resume      his lisinopril but to continue to hold off the HCTZ until he      follows up with Dr. Maceo Pro.  4. Hypertension:  As above, patient to continue lisinopril upon      discharge as discussed above and follow up with primary care      physician.   DISCHARGE MEDICATIONS:  1. Lisinopril 20 mg p.o. daily.  2. Coumadin 5 mg p.o. daily and follow up with PCP.  3. Patient to continue aspirin, colchicine p.r.n., folic acid.   As above, patient to hold off HCTZ until followup with Dr. Maceo Pro.   FOLLOWUP CARE:  1. Right lower extremity Doppler ultrasound in two weeks as scheduled.  2. Dr. Oneida Alar, vascular surgery, on July 17 at 1 p.m.  3. Dr. Georga Bora on July 7 for PT/INR as above.   DISCHARGE CONDITION:  Improved, stable.      Sheila Oats, M.D.  Electronically Signed     ACV/MEDQ  D:  07/19/2006  T:  07/19/2006  Job:  [id number]   cc:   [name], M.D.  Fax: [place] [place], [place], [place] [address]

## 2010-06-01 NOTE — Op Note (Signed)
[facility]  Patient:    PARRISH, SPOMER                     MRN: [id number] Proc. Date: 01/18/00 Adm. Date:  [id number] Attending:  Minda Meo                           Operative Report  PREOPERATIVE DIAGNOSIS:  Left L4 and L5 herniated disk with foot drop, hypertrophy, and degenerative disk disease.  POSTOPERATIVE DIAGNOSIS:  Left L4 and L5 herniated disk with foot drop, hypertrophy, and degenerative disk disease.  PROCEDURE:  Left L5 hemilaminectomy with left L4-5 diskectomy and left 4-5 and 5-1 foraminotomy, microdissection.  SURGEON:  Zigmund Daniel. Joya Salm, M.D.  ASSISTANT:  Hosie Spangle, M.D.  CLINICAL HISTORY:  The patient is a gentleman seen by me because of weakness of the left foot.  It was found that this gentleman had foot drop and a herniated disk with degenerative disk and disease and hypertrophy of the facet at the level of 4-5, above and below.  This had been going on for five months. The patient wanted to wait for surgery after the holidays, although he knew that having foot drop, the longer that he waited, the more difficult it was to recover in his strength.  Nevertheless, the patient decided to go ahead with surgery today.  He knew of the risks such as infection, CSF leak, worsening of the pain, paralysis, and no improvement whatsoever with the left foot weakness.  DESCRIPTION OF PROCEDURE:  The patient was taken to the OR and he was positioned in a prone manner.  A midline incision from L4-5 was made.  Muscles were retracted laterally.  We did an x-ray which showed that indeed we were at the level of 4-5.  Because of the hypertrophy of the facet, we proceeded with removing the lamina of L5, and we removed a thick yellow ligament using microdissection.  We identified the L5 nerve root and indeed there was a herniated disk compromising the takeoff of the L5 nerve root.  An incision was made and large amounts  of degenerative disk disease were removed.  This procedure was medial and laterally using the microcurets.  Foraminotomy was accomplished.  Then, we dropped down to the L5-S1 space, where there was some narrowing and foraminotomy was accomplished.  Then, at the end, we had a good decompression.  Valsalva maneuver was negative.  Fentanyl and Depo-Medrol were left in the dural space and the wound was closed with Vicryl and Steri-Strips. DD:  01/18/00 TD:  01/18/00 Job: [id number]

## 2010-06-01 NOTE — Discharge Summary (Signed)
Glenn, Hicks NO.:  [id number]   MEDICAL RECORD NO.:  [id number]          PATIENT TYPE:  INP   LOCATION:  [id number]                         FACILITY:  [facility]   PHYSICIAN:  Jerelene Redden, MD      DATE OF BIRTH:  [birth year]   DATE OF ADMISSION:  05/15/2004  DATE OF DISCHARGE:                                 DISCHARGE SUMMARY   Glenn Hicks is a 77 year old man who initially presented to [facility] on May 15, 2004 with acute onset of shortness of breath.  On  presentation he recalled that over the last two weeks he had been treated  for diverticulitis with an antibiotic and that during the course of his  treatment, he noted that he had developed right ankle and leg pain.  On  presentation to the emergency room BNP was 51.3, creatinine 2.0, CBC was  notable for a white count of 10,600, D-Dimer 11.62, chest x-ray was  negative, ventilation perfusion scan showed high-probability for a pulmonary  embolus with finding of large regions of wedge-shaped peripheral perfusion  defects in the left mid lung, right mid lung, left lung base, and  subsegmental apical defects bilaterally.  Subsequently, the patient  underwent venous Doppler study which showed evidence of an extensive acute  DVT in the peroneal and posterior tibial veins extending into the popliteal  vein, this was noted on the right, the left venous Doppler study was  negative.  Therefore on May 15, 2004, the patient was begun on intravenous  heparin and on May 16, 2004, the patient was begun on Coumadin per standard  pharmacy protocol.  Subsequent workup included a hypercoagulability panel  the results of which I have reviewed.  The most significant finding on this  panel appears to be a homocystine level of 20.15.  In light of this finding,  a vitamin B12 level was obtained which was 582.  I placed the patient on  folic acid replacement.  Mr. Helmandollar did well during the course of his  hospitalization and  tolerated the heparin and Coumadin therapy.  Initially  during his stay, he had a renal ultrasound which was unremarkable except for  showing a large urine residual.  The patient stated that this was because he  was on bedrest and had difficulty emptying his bladder when he was  recumbent, subsequently when he was urinating while standing up, a bladder  scan was done which showed a urine residual about 60 mL.  This appeared to  be consistent with previous studies that had been done by his urologist.  A  2D echo was obtained which showed that the left ventricular systolic  function was mildly decreased, it was thought the ejection fraction was  somewhere between 40-50%, the left ventricular wall thickness was felt to be  normal, and they really could not comment on left ventricular wall motion.  By __________the patient had a therapeutic INR from 24 hours, and per  protocol it was felt reasonable to discharge the patient on oral Coumadin.   DISCHARGE DIAGNOSES:  1.  Acute pulmonary embolus.  2.  Deep venous thrombophlebitis, right leg.  3.  History of diverticular disease.  4.  History of colon polyps.  5.  History of hypertension.  6.  Benign prostatic hypertrophy.   DISCHARGE MEDICATIONS:  The patient was given written instructions in regard  to his Coumadin therapy.  I advised him to alternate between 2.5 and 5 mg of  Coumadin.  He was instructed to return to dr. Delman Kitten office the following  Thursday for a followup prothrombin time.  Other discharge medications will  consist of folic acid 1 mg b.i.d.  The patient was also instructed to resume  his blood pressure medication on Monday.  I went over his activity  instructions with him and encouraged him in general to be active, he was  given instructions about appropriate diet  and about refraining from activities that might put him at risk for  excessive bleeding.  The importance of regularly checking his prothrombin  time  well-appearing emphasized.   CONDITION ON DISCHARGE:  Good.      SY/MEDQ  D:  05/20/2004  T:  05/20/2004  Job:  [id number]   cc:   Lucina Mellow. Terance Hart, M.D.  [place]. [address], [address] [place]  [place] [address]  Fax: [place] Maceo Pro, M.D.  [address] [place]  [place] [address]  Fax: [phone]

## 2010-06-01 NOTE — H&P (Signed)
[place]  Patient:    Glenn Hicks, Glenn Hicks                     MRN: [id number] Adm. Date:  [id number] Attending:  Minda Meo                         History and Physical  HISTORY OF PRESENT ILLNESS:  Mr. Miu is a gentleman who around six months ago, developed pain in the right foot, and he was seen by the orthopedic surgeon who felt it was mostly tendonitis.  The x-ray of the foot shows some inflammation.  He was given some medication including local injection with no improvement.  Later on, the foot pain improved a little bit, but then he realized that he was having pain down from his back all the way down to the left leg associated with weakness.  He has some injections in the epidural space with no improvement.  An MRI was obtained and sent to [place], emergency, on December 17.  At the time we saw Mr. Adinolfi, we advised surgery, but he wanted to wait until after the holidays.  He is complaining of weakness.  He tells me that the weakness has been going on for almost five months and he is concerned because it is not any better.  He denies any pain in the right leg.  PAST MEDICAL HISTORY:  He is not allergic to any medication.  He is taking Altace for high blood pressure.  The patient has had a cardiac test which was negative.  SOCIAL HISTORY:  The patient does not smoke.  He does not drink.  REVIEW OF SYSTEMS:  Positive for high cholesterol, high blood pressure, shortness of breath, and tendonitis.  FAMILY HISTORY:  Mother died at the age of 37 with a heart attack.  PHYSICAL EXAMINATION:  GENERAL:  A patient who came to my office and he was limping from the left leg.  HEENT:  Normal.  Neck normal.  Nose clear.  CVS:  Heart sounds normal.  EXTREMITIES:  There were normal extremities.  Normal pulses.  NEURO:  Mental status normal.  Cranial nerves normal.  The strength was 5/5, except in the left foot, but he has a plantar  flexion which is 5/5 and dorsiflexion 1/5.  Reflexes are symmetrical.  No Babinski.  _____ bilaterally.  Sensation normal.  The patient is able walk on his tiptoes and heel with the right foot, but not with the left one.  The MRI showed that indeed he has several areas of degenerative disk disease, but at the level of 4-5, he has a lumbar stenosis with a herniated disk, mostly going to the left side affecting the left L4-L5 level.  IMPRESSION: 1. Left L4-5 herniated disk with a severe radiculopathy and foot drop.  2. Tendonitis of the right foot.  RECOMMENDATIONS:  The patient will be admitted for surgery.  He knows about the risks which include infection, CSF leak, _____ paralysis, and the possibility that after six months, the foot strength will not return.  He declined other opinion. DD:  01/18/00 TD:  01/18/00 Job: [id number]

## 2010-06-01 NOTE — Assessment & Plan Note (Signed)
Glenn Hicks                           GASTROENTEROLOGY OFFICE NOTE   AKSHAJ, SPARHAWK                     MRN:          [id number]  DATE:10/21/2005                            DOB:          Dec 21, 1928    Mr. Glenn Hicks is a 77 year old white male that I have followed for many years  because of recurrent colon polyps, going back at least to 50.  His last  colonoscopy exam was two years ago.  He did have some more adenomatous  polyps removed, and has rather extensive sigmoid colon diverticulosis.   He is referred today because of 3 to 4 months of constant abdominal pain in  all quadrants of his abdomen without gas, bloating, change in bowel habits,  melena or hematochezia, anorexia, weight loss, systemic complaints,  __________ or biliary problems.  He does have acid reflux, and is taking  over-the-counter antacids.  He denies associated dysphagia.  He has never  had previous endoscopy.   He describes this abdominal pain as constant and mild in nature, and it is  not limiting any of his activities.  It does not awaken him from sleep.  There have been no systemic complaints such as fever or chills.  He is  followed by Dr. Maceo Pro, had recent CBC and metabolic profile that was normal  except for mild chronic renal insufficiency, with a creatinine of 2.1 mg%.  His hemoglobin was 13, and he did have slightly macrocytic indices, with an  MCV of 96.   PAST MEDICAL HISTORY:  Remarkable for gouty arthritis and hypertension.  He  is followed by Dr. Terance Hart because of a mild obstructive uropathy.  He has  had previous Cardiolite stress testing that showed no evidence of ischemic  heart disease.  He was admitted in May of 2006 with a pulmonary emboli and  had deep venous thrombosis, and was on Coumadin for 6 months.  He has had no  residual cardiopulmonary difficulties of note.   MEDICATIONS:  1. Colchicine 0.6 mg twice a day.  2. Folbic twice a day.  3.  Lisinopril/hydrochlorothiazide daily.  4. Aspirin 81 mg a day.   ALLERGIES:  He denies allergies.   FAMILY HISTORY:  Otherwise noncontributory.   SOCIAL HISTORY:  The patient is married and lives with his wife.  He has a  high school education, is retired from [employer].  He does  not smoke or use ethanol.   REVIEW OF SYSTEMS:  Noncontributory, without current cardiovascular or  pulmonary, genitourinary, neurological or neuropsychiatric problems.   PHYSICAL EXAMINATION:  He is 5 feet 9 inches tall, weighs 184 pounds.  Blood  pressure is 124/84 and pulse was 74 and regular.  He is a healthy-appearing white male appearing his stated age, in no acute  distress.  I could not appreciate stigmata of chronic liver disease.  CHEST:  Remarkable in that there were wheezes of a rather marked nature at  the right lung base and in the right middle lobe area.  I could not  appreciate dullness to percussion.  He appeared to be in a regular rhythm  without significant murmurs, gallops or rubs.  ABDOMEN:  Showed no hepatosplenomegaly, masses, tenderness or distention.  Bowel sounds were normal.  RECTAL:  Inspection of the rectum was unremarkable, as was rectal exam.  Stool was guaiac-negative.  EXTREMITIES:  Peripheral extremities were unremarkable.  NEUROLOGIC:  Mental status was clear.   ASSESSMENT:  1. Acid reflux disease with probable peptic stricture of the esophagus.  2. Long history of recurrent colon polyps and previous diverticulitis.  3. Four months of chronic abdominal pain of unexplained etiology - rule      out occult malignancy.  4. History of mild renal insufficiency from obstructive uropathy.  5. Previous history of pulmonary emboli with resultant probable chronic      scar in his right lung base.  6. History of gouty arthritis.  7. History of essentially hypertension.   RECOMMENDATIONS:  1. Outpatient endoscopy and colonoscopy.  2. Reflux regimen along with  daily Aciphex therapy.  3. Check B12 and folate levels for macrocytosis.  4. Consider abdominal-pelvic CT scanning.  5. Continue antihypertensive medication and other medications as per Dr.      Maceo Pro.       [name], MD, [place], [place]      DRP/MedQ  DD:  10/21/2005  DT:  10/23/2005  Job #:  [id number]   cc:   [name], M.D.

## 2010-06-01 NOTE — Discharge Summary (Signed)
Glenn Hicks, Glenn Hicks NO.:  [id number]   MEDICAL RECORD NO.:  [id number]          PATIENT TYPE:  INP   LOCATION:  [id number]                         FACILITY:  [facility]   PHYSICIAN:  Jerelene Redden, MD      DATE OF BIRTH:  14-Nov-1928   DATE OF ADMISSION:  05/15/2004  DATE OF DISCHARGE:  05/21/2004                                 DISCHARGE SUMMARY   Glenn Hicks is a 77 year old man with a past history of hypertension  and benign prostatic hypertrophy who initially presented on May 2 to [facility] after awakening that morning and experiencing acute weakness  with associated acute shortness of breath.  Physical exam at the time of  presentation by Dr. Billey Chang revealed a temperature of 98.2, blood  pressure 137/83, pulse was 105.  HEENT examination was within normal limits.  The chest was clear.  Cardiovascular examination revealed a sinus  tachycardia.  The abdomen was benign.  Neurologic testing was within normal  limits.  Examination of extremities revealed evidence of bilateral calf  tenderness.  Relevant laboratory studies obtained included a white count  10,600, hemoglobin of 14.2.  The D-dimer was positive of 11.62.  Subsequently, the patient underwent a portable chest x-ray which was  negative.  A ventilation perfusion lung scan showed evidence of bilateral  pulmonary emboli felt to be high probability.  It is also noteworthy that  subsequently the patient underwent a venous Doppler study which showed  extensive acute DVT through the peroneal and posterior tibial veins into the  popliteal vein on the right.  The thrombus was mobile in the popliteal vein.  On the left the venous Doppler study was unremarkable.  In light of the  diagnosis of pulmonary embolus the patient was started on heparin protocol  per pharmacy and was placed on telemetry.  The next day Coumadin was begun  again per pharmacy protocol.  A hypercoagulability panel was obtained  which  revealed negative for factor V Leiden.  Lupus anticoagulant was not  detected.  Protein C and protein S were normal.  Homocystine was 20.15 which  is significantly elevated.  PSA was 3.78.  In light of the elevated  homocystine the patient was started on folic acid.  The heparin level was  carefully monitored and the prothrombin time was followed on the Coumadin  protocol.  By May 7 the INR was 3.2 and on May 8 it was 3.3.  Therefore on  May 8 it was felt reasonable to discharge the patient.   DISCHARGE DIAGNOSES:  1.  Acute pulmonary embolus.  2.  Deep vein thrombophlebitis.  3.  History of diverticular disease.  4.  Hypertension.  5.  Colon polyps.   DISCHARGE MEDICATIONS:  1.  Folic acid 1 mg b.i.d.  2.  Coumadin.  The patient was instructed to take 1 mg Monday, 2.5 mg      Tuesday and Wednesday, and then go Dr. Delman Kitten office on Thursday for      measurement of prothrombin time and follow-up advice about Coumadin      therapy.  3.  At the time of discharge the patient was advised to resume his usual      blood pressure medication which I believe      is lisinopril 20/25 one tablet daily.  He was advised to return to Dr.      Delman Kitten office initially on Thursday as described above for prothrombin      determination, and subsequently on a regular basis to follow his course.   CONDITION AT TIME OF DISCHARGE:  Good.       SY/MEDQ  D:  08/17/2004  T:  08/17/2004  Job:  [id number]   cc:   [name], M.D.  [address] [place]  [place] [address]  Fax: [phone]

## 2010-06-01 NOTE — Op Note (Signed)
Glenn Hicks, Glenn Hicks              ACCOUNT NO.:  [id number]   MEDICAL RECORD NO.:  [id number]          PATIENT TYPE:  AMB   LOCATION:  SDS                          FACILITY:  [facility]   PHYSICIAN:  Glenn Hicks, M.D.DATE OF BIRTH:  1928/01/15   DATE OF PROCEDURE:  01/22/2006  DATE OF DISCHARGE:                               OPERATIVE REPORT   CCS [id number]   PREOPERATIVE DIAGNOSES:  1. Symptomatic gallstones.  2. Umbilical hernia.   POSTOPERATIVE DIAGNOSES:  1. Asymptomatic gallstones.  2. Umbilical hernia.   OPERATION:  Laparoscopic cholecystectomy with operative cholangiogram,  repair of umbilical hernia.   SURGEON:  Glenn Hicks, M.D.   ASSISTANT:  Glenn Hicks. Glenn Hicks, M.D.   ANESTHESIA:  General endotracheal.   CLINICAL HISTORY:  Mr. Glenn Hicks is a 77 year old with some nonspecific  abdominal pains but also some that sounded very biliary and he was known  to have gallstones.  He elected to proceed to laparoscopic  cholecystectomy.  He has an umbilical hernia which we discussed would  need to be repaired as part of the closure from the umbilical port site.   DESCRIPTION OF PROCEDURE:  The patient was seen in the holding area and  he had no further questions.  He was taken to the operating room and  after satisfactory general endotracheal anesthesia had been obtained,  the abdomen was clipped, prepped and draped.  The time-out occurred.   0.25% plain Marcaine was used for each incision.  The umbilical incision  was made and the hernia sac identified and the peritoneal cavity entered  directly in the hernia area through the fascial defect.  A pursestring  of 0 Vicryl was placed and the Hasson introduced.  The abdomen was  insufflated to 15.   The patient placed in reverse Trendelenburg and tilted to the left.  Under direct vision a 10/11 trocar was placed in the epigastrium and two  5 mm laterally.   The gallbladder looked grossly normal.  There no other  gross  abnormalities noted in the peritoneal cavity.   The gallbladder was retracted over the liver and the peritoneum over the  cystic duct area opened and I dissect out a nice triangle of Calot,  seeing the cystic duct and cystic artery and having opened the  peritoneum on both sides of the gallbladder, so that we had a nice  window.   I placed a single clip on the artery and one on the cystic duct at the  junction with the gallbladder.   A Cook catheter was introduced percutaneously.  A small opening was made  in the cystic duct and the catheter placed and held with a clip.  Operative cholangiography showed no filling defects, good filling of the  common duct and the duodenum, and normal filling of the hepatic  radicals.   Cystic duct catheter was removed and three clips placed on the stay side  of the cystic duct.  It was divided.  Two additional clips were placed  on the cystic artery and it was divided.  A posterior branch of the  cystic artery was then  dissected out, clipped and divided.   The gallbladder was removed from below to above.  A couple of small  vessels along the bed of gallbladder were clipped.  Once gallbladder was  disconnected, it was placed in a bag and brought out the umbilical port.   At this point I removed the umbilical trocar and made that skin incision  a little bit longer.  Using cautery I freed up the subcu tissues and  fascia so I could see clearly the fascial defect.  This was then closed  with three sutures of interrupted 0 Prolene.   Final check was made for hemostasis by reinsufflating and placing the  camera in the epigastric port.  Remaining irrigant was suctioned out  over the gallbladder.  Some omental tissue that was caught up in the  umbilical incision was gently dissected out so that we could identify  this closure and it appeared good.   Once everything appeared dry, the lateral ports removed.  The abdomen  was deflated through the  epigastric port.  Skin was closed with 4-0  Monocryl subcuticular plus Dermabond.   The patient tolerated the procedure well.  There no operative  complications.  All counts were correct.      Glenn Hicks, M.D.  Electronically Signed     CJS/MEDQ  D:  01/22/2006  T:  01/22/2006  Job:  [id number]   cc:   Glenn Hicks, M.D.  Glenn Pacas. Sharlett Iles, MD, Glenn Hicks, Glenn Hicks

## 2010-06-01 NOTE — H&P (Signed)
Glenn Hicks, Glenn Hicks              ACCOUNT NO.:  [id number]   MEDICAL RECORD NO.:  [id number]          PATIENT TYPE:  INP   LOCATION:  3737                         FACILITY:  [facility]   PHYSICIAN:  Melissa L. Lovena Le, MD  DATE OF BIRTH:  07/27/1928   DATE OF ADMISSION:  05/15/2004  DATE OF DISCHARGE:                                HISTORY & PHYSICAL   CHIEF COMPLAINT:  Weakness and shortness of breath.   PRIMARY CARE PHYSICIAN:  Georga Bora.   UROLOGIST:  Lucina Mellow. Terance Hart, M.D.   GI PHYSICIAN:  [name], M.D.   HISTORY OF PRESENT ILLNESS:  The patient is a 77 year old white male who  awoke this morning and after having his coffee began to wash up when he  suddenly developed the acute onset of weakness and panting according to his  wife.  The patient's shortness of breath continued and the patient was  encouraged to come to the hospital.  The patient states that over the past  couple of weeks he has been treated for diverticulitis and completed an  antibiotic course and during treatment developed some right ankle pain that  proceeded to right leg pain and left leg pain.  He thought that this was all  related to the antibiotics and reviewed this with his primary care  physician, but had completed the course of antibiotics and felt that it  would go away.  On the day of admission, when the new symptoms resolved, he  came to his primary care physician's office and was sent to the emergency  room for further evaluation.   REVIEW OF SYSTEMS:  Recently he has been a little bit more fatigued than  usual.  He is an active golfer but generally rides instead of walks.  He  states that on Monday his ankle started to hurt and then while he was  walking his legs became achy and he had some increased pain up to the level  of the knee.  He states that he has not been sleeping very well secondary to  the left lower quadrant abdominal pain related to the diverticulitis.  He  has had some  nausea but no vomiting.  He has had no palpitations and, as  stated, recently had some abdominal discomfort related to diverticulitis.  All other review of systems appear to be negative.  He relates no long  distance travel by air or by car and generally he is active and he states  that he push-mows his front law, taking a break at times but is able to do  this activity.   PAST MEDICAL HISTORY:  1.  Diverticular disease.  2.  Hypertension.  3.  He had a colonoscopy last year by Dr. Sharlett Iles, over the years he has      had multiple polyps removed.   PAST SURGICAL HISTORY:  1.  He had a ruptured disk repaired.  2.  Vocal cord polyps removed in his early 2s.   SOCIAL HISTORY:  No tobacco, no alcohol.  He works for [employer] and has  had exposure in the past to  petroleum products.   FAMILY HISTORY:  Mother is deceased secondary to breast cancer.  Father is  deceased in his 52s of unknown condition.  Sister is deceased secondary to  cancer.   ALLERGIES:  NO KNOWN DRUG ALLERGIES.   MEDICATIONS:  Unknown.  Blood pressure medications although after speaking  with the patient's doctor's office:  Lisinopril/hydrochlorothiazide 20/25.   The most recent creatinine that the office had for him was 26 for a BUN and  2.0 for creatinine.   PHYSICAL EXAMINATION:  VITAL SIGNS:  Temperature 98.2, blood pressure  137/83, pulse 102/15, respiratory rate 20/28, saturation 96% on 2 liters.  GENERAL:  He is in no acute distress.  He is slightly dyspneic with talking.  HEENT:  Normocephalic, atraumatic.  Pupils are equal, round and reactive to  light, extraocular movements are intact.  Mucous membranes are moist.  NECK:  Supple, there is no JVD, no lymph nodes and no carotid bruits.  CHEST:  Clear to auscultation with no rhonchi, rales or wheezes.  CARDIOVASCULAR:  Tachycardic, positive S1 and S2, no S3 or S4, no murmurs,  rubs or gallops.  ABDOMEN:  Soft, nontender, non-distended, with positive  bowel sounds.  EXTREMITIES:  Bilateral calf tenderness and 2+ pulses.  No edema is noted.  NEUROLOGIC:  He is awake, alert and oriented x3.  Cranial nerves II-XII are  intact.  Power is 5/5 and DTRs are 2+.   LABORATORY DATA:  White count 10.6, hemoglobin 14.2, hematocrit 41.5, and pl  298,000.  Sodium 134, potassium 4.5, chloride 104, CO2 21, BUN 30,  creatinine 2.2, glucose 99, calcium 9.4.  Enzymes are slightly elevated with  a myoglobin and troponin.  BNP is 51.3, D-dimer is 11.62.   Chest x-ray shows no acute disease but a V/Q scan shows positive pulmonary  emboli.   ASSESSMENT AND PLAN:  This is a 77 year old white male with a past medical  history for benign prostatic hypertrophy and hypertension, who was admitted  with acute weakness and shortness of breath.  He was found to have pulmonary  emboli of unclear etiology on V/Q scan.  Heparin was started in the  emergency department and there is no recent long distance travel reported  and the patient is fairly active.   1.  Cardiovascular and sinus tachycardia secondary to pulmonary emboli.  We      will monitor him on telemetry and check a 2-D echocardiogram.  At this      time, I would like to hold his lisinopril and hydrochlorothiazide      because of his elevated creatinine.  2.  Pulmonary, acute pulmonary emboli.  We will continue supplemental oxygen      and I have recommended incentive spirometry, continue his heparin and      because of his recent diverticulitis I would like to use heparin instead      of Lovenox because of his risk for bleeding.  We could potentially start      Coumadin tomorrow unless a GI examination is warranted to rule out      possible occult malignancy.  3.  Gastrointestinal, diverticular disease, status post antibiotic therapy.      We should potentially consider a GI consult before starting Coumadin     and, as stated, in case the patient needs a colonoscopy to evaluate for      GI malignancy.   4.  GU, increased creatinine with a history of urinary hesitancy.  I will be  checking a PSA but it sounds as if the patient may have some BPH that      may be contributing to an obstructive uropathy.  If this is the case,      then Dr. Terance Hart could be consulted.  I would like to check an      ultrasound of the kidneys and a bladder scan to rule out      elevated postvoid residual.  5.  Endocrine.  No history of diabetes.  6.  Lower extremity pain.  I would like to check Dopplers to rule out deep      vein thrombosis as a source of his PE.      MLT/MEDQ  D:  05/16/2004  T:  05/16/2004  Job:  [id number]   cc:   Lucina Mellow. Terance Hart, M.D.  [place]. [address] [place]  [place] [address]  Fax: [place]. Sharlett Iles, M.D. [facility]

## 2010-06-01 NOTE — Consult Note (Signed)
[place]. [facility]  Patient:    Glenn Hicks, Glenn Hicks                     MRN: [id number] Proc. Date: 01/19/00 Adm. Date:  [id number] Attending:  Minda Meo                          Consultation Report  CHIEF COMPLAINT:  Trouble voiding.  HISTORY OF PRESENT ILLNESS:  This is a 77 year old man who underwent L5 disk surgery yesterday by Zigmund Daniel. Joya Salm, M.D. for HNP.  Since then, he has had trouble urinating, voiding in small amounts.  Overnight he had residuals of 1000, 1100 cc.  This morning he was in-and-out catheterized for 400 cc residual, which was sent for UA and culture.  By history, the patient has BPH and has been seeing Lucina Mellow. Terance Hart, M.D., on and off over the years.  He usually sees him in December, although he missed his appointment this year.  He does have a history of retention about 10 years ago, which resolved with apparent alpha blocker.  Normally the patient has trouble urinating until he gets up in the morning and starts to ambulate, then he seems to empty out better.  He has had no GU surgery and over the years apparently has gotten along fairly well.  LABORATORY DATA:  His electrolytes showed a sodium of 131, a potassium of 4.6, his BUN was 20, creatinine 1.7 upon admission.  Admitting urine was clear, uninfected.  PHYSICAL EXAMINATION:  GENERAL:  He is a pleasant, talkative man in no acute distress.  RECTAL:  By rectal exam, he has good anal sphincter tone.  The prostate is not markedly enlarged, +1, smooth, benign, no nodules, nontender, no other anal or rectal lesions.  IMPRESSION: 1. Postoperative urinary retention with high postvoid residual urine. 2. History of benign prostatic hypertrophy. 3. Status post herniated nucleus pulposus surgery January 18, 2000.  DISCUSSION:  The patient was with his family, and for now I recommended that we start him on a trial of Flomax 0.4 mg and continue with  in-and-out catheterization about every six to eight hours.  I anticipate that with time as he gets over his surgery, becomes more ambulatory, and we can reduce his pain medicine, that he will start to void satisfactorily again.  We elected to keep him one more day and recheck him in the morning.  I did write a prescription for Flomax 0.4 mg, #15, one a day, to take home with him.  He will then hopefully follow up with Dr. Hessie Diener on a timely basis. DD:  01/19/00 TD:  01/19/00 Job: [id number]

## 2010-10-30 LAB — BASIC METABOLIC PANEL
BUN: 22
BUN: 23
BUN: 27 — ABNORMAL HIGH
CO2: 20
CO2: 22
CO2: 24
Calcium: 8.7
Calcium: 8.7
Calcium: 9
Chloride: 109
Chloride: 110
Chloride: 112
Creatinine, Ser: 1.6 — ABNORMAL HIGH
Creatinine, Ser: 1.79 — ABNORMAL HIGH
Creatinine, Ser: 1.88 — ABNORMAL HIGH
GFR calc Af Amer: 43 — ABNORMAL LOW
GFR calc Af Amer: 45 — ABNORMAL LOW
GFR calc Af Amer: 51 — ABNORMAL LOW
GFR calc non Af Amer: 35 — ABNORMAL LOW
GFR calc non Af Amer: 37 — ABNORMAL LOW
GFR calc non Af Amer: 42 — ABNORMAL LOW
Glucose, Bld: 91
Glucose, Bld: 91
Glucose, Bld: 95
Potassium: 4.5
Potassium: 4.9
Potassium: 5.1
Sodium: 137
Sodium: 139
Sodium: 139

## 2010-10-30 LAB — CBC
HCT: 34.1 — ABNORMAL LOW
Hemoglobin: 11.9 — ABNORMAL LOW
MCHC: 34.8
MCV: 93.5
Platelets: 212
RBC: 3.65 — ABNORMAL LOW
RDW: 13.1
WBC: 5.5

## 2010-10-30 LAB — PROTIME-INR
INR: 1.2
INR: 1.3
INR: 1.8 — ABNORMAL HIGH
INR: 2.5 — ABNORMAL HIGH
INR: 2.7 — ABNORMAL HIGH
Prothrombin Time: 15.2
Prothrombin Time: 16.3 — ABNORMAL HIGH
Prothrombin Time: 21.9 — ABNORMAL HIGH
Prothrombin Time: 28.6 — ABNORMAL HIGH
Prothrombin Time: 30.6 — ABNORMAL HIGH

## 2010-10-31 LAB — BASIC METABOLIC PANEL
BUN: 35 — ABNORMAL HIGH
CO2: 20
Calcium: 9.1
Chloride: 105
Creatinine, Ser: 2.26 — ABNORMAL HIGH
GFR calc Af Amer: 34 — ABNORMAL LOW
GFR calc non Af Amer: 28 — ABNORMAL LOW
Glucose, Bld: 105 — ABNORMAL HIGH
Potassium: 4.2
Sodium: 135

## 2010-10-31 LAB — DIFFERENTIAL
Basophils Absolute: 0
Basophils Relative: 0
Eosinophils Absolute: 0.2
Eosinophils Relative: 2
Lymphocytes Relative: 12
Lymphs Abs: 1.1
Monocytes Absolute: 0.5
Monocytes Relative: 5
Neutro Abs: 7.7
Neutrophils Relative %: 80 — ABNORMAL HIGH

## 2010-10-31 LAB — CBC
HCT: 34.9 — ABNORMAL LOW
Hemoglobin: 12.1 — ABNORMAL LOW
MCHC: 34.6
MCV: 94.1
Platelets: 212
RBC: 3.71 — ABNORMAL LOW
RDW: 13.2
WBC: 9.6

## 2010-10-31 LAB — PROTIME-INR
INR: 1
Prothrombin Time: 13.6

## 2010-10-31 LAB — D-DIMER, QUANTITATIVE: D-Dimer, Quant: 3.05 — ABNORMAL HIGH

## 2011-05-30 ENCOUNTER — Encounter (INDEPENDENT_AMBULATORY_CARE_PROVIDER_SITE_OTHER): Payer: Medicare Other | Admitting: Ophthalmology

## 2011-05-30 DIAGNOSIS — H4310 Vitreous hemorrhage, unspecified eye: Secondary | ICD-10-CM

## 2011-05-30 DIAGNOSIS — H43819 Vitreous degeneration, unspecified eye: Secondary | ICD-10-CM

## 2011-05-30 DIAGNOSIS — H2700 Aphakia, unspecified eye: Secondary | ICD-10-CM

## 2011-05-30 DIAGNOSIS — H33309 Unspecified retinal break, unspecified eye: Secondary | ICD-10-CM

## 2011-06-06 ENCOUNTER — Encounter (INDEPENDENT_AMBULATORY_CARE_PROVIDER_SITE_OTHER): Payer: Medicare Other | Admitting: Ophthalmology

## 2011-06-06 DIAGNOSIS — H33309 Unspecified retinal break, unspecified eye: Secondary | ICD-10-CM

## 2011-06-06 DIAGNOSIS — H4310 Vitreous hemorrhage, unspecified eye: Secondary | ICD-10-CM

## 2011-06-06 DIAGNOSIS — H43819 Vitreous degeneration, unspecified eye: Secondary | ICD-10-CM

## 2011-06-21 ENCOUNTER — Ambulatory Visit (INDEPENDENT_AMBULATORY_CARE_PROVIDER_SITE_OTHER): Payer: Medicare Other | Admitting: Ophthalmology

## 2011-06-21 DIAGNOSIS — H4310 Vitreous hemorrhage, unspecified eye: Secondary | ICD-10-CM

## 2011-06-21 DIAGNOSIS — H43819 Vitreous degeneration, unspecified eye: Secondary | ICD-10-CM

## 2011-06-21 DIAGNOSIS — H33309 Unspecified retinal break, unspecified eye: Secondary | ICD-10-CM

## 2011-07-22 ENCOUNTER — Encounter (INDEPENDENT_AMBULATORY_CARE_PROVIDER_SITE_OTHER): Payer: Medicare Other | Admitting: Ophthalmology

## 2011-07-22 DIAGNOSIS — H43819 Vitreous degeneration, unspecified eye: Secondary | ICD-10-CM

## 2011-07-22 DIAGNOSIS — H4310 Vitreous hemorrhage, unspecified eye: Secondary | ICD-10-CM

## 2012-01-22 ENCOUNTER — Ambulatory Visit (INDEPENDENT_AMBULATORY_CARE_PROVIDER_SITE_OTHER): Payer: Medicare Other | Admitting: Ophthalmology

## 2012-01-22 DIAGNOSIS — H33309 Unspecified retinal break, unspecified eye: Secondary | ICD-10-CM

## 2012-01-22 DIAGNOSIS — H43819 Vitreous degeneration, unspecified eye: Secondary | ICD-10-CM

## 2012-05-19 ENCOUNTER — Other Ambulatory Visit: Payer: Self-pay | Admitting: Orthopedic Surgery

## 2012-05-19 ENCOUNTER — Encounter (HOSPITAL_BASED_OUTPATIENT_CLINIC_OR_DEPARTMENT_OTHER): Payer: Self-pay | Admitting: *Deleted

## 2012-05-19 NOTE — Progress Notes (Signed)
To come in 05/25/12 for pt ptt [id number]

## 2012-05-25 ENCOUNTER — Encounter (HOSPITAL_BASED_OUTPATIENT_CLINIC_OR_DEPARTMENT_OTHER)
Admission: RE | Admit: 2012-05-25 | Discharge: 2012-05-25 | Disposition: A | Discharge status: Home / Self Care | Payer: Medicare Other | Source: Ambulatory Visit | Attending: Orthopedic Surgery | Admitting: Orthopedic Surgery

## 2012-05-25 LAB — PROTIME-INR
INR: 1.07 (ref 0.00–1.49)
Prothrombin Time: 13.8 seconds (ref 11.6–15.2)

## 2012-05-25 LAB — BASIC METABOLIC PANEL
BUN: 42 mg/dL — ABNORMAL HIGH (ref 6–23)
CO2: 24 mEq/L (ref 19–32)
Calcium: 10.2 mg/dL (ref 8.4–10.5)
Chloride: 101 mEq/L (ref 96–112)
Creatinine, Ser: 2.51 mg/dL — ABNORMAL HIGH (ref 0.50–1.35)
GFR calc Af Amer: 26 mL/min — ABNORMAL LOW (ref >90)
GFR calc non Af Amer: 22 mL/min — ABNORMAL LOW (ref >90)
Glucose, Bld: 87 mg/dL (ref 70–99)
Potassium: 4.3 mEq/L (ref 3.5–5.1)
Sodium: 135 mEq/L (ref 135–145)

## 2012-05-25 LAB — APTT: aPTT: 26 seconds (ref 24–37)

## 2012-05-25 NOTE — Progress Notes (Signed)
BMET reviewed by Dr Girard Cooter. No new orders received.

## 2012-05-26 ENCOUNTER — Encounter (HOSPITAL_BASED_OUTPATIENT_CLINIC_OR_DEPARTMENT_OTHER): Payer: Self-pay | Admitting: *Deleted

## 2012-05-26 ENCOUNTER — Ambulatory Visit (HOSPITAL_BASED_OUTPATIENT_CLINIC_OR_DEPARTMENT_OTHER)
Admission: RE | Admit: 2012-05-26 | Discharge: 2012-05-26 | Disposition: A | Discharge status: Home / Self Care | Payer: Medicare Other | Source: Ambulatory Visit | Attending: Orthopedic Surgery | Admitting: Orthopedic Surgery

## 2012-05-26 ENCOUNTER — Encounter (HOSPITAL_BASED_OUTPATIENT_CLINIC_OR_DEPARTMENT_OTHER)
Admission: RE | Disposition: A | Discharge status: Home / Self Care | Payer: Self-pay | Source: Ambulatory Visit | Attending: Orthopedic Surgery

## 2012-05-26 ENCOUNTER — Encounter (HOSPITAL_BASED_OUTPATIENT_CLINIC_OR_DEPARTMENT_OTHER): Payer: Self-pay | Admitting: Certified Registered Nurse Anesthetist

## 2012-05-26 ENCOUNTER — Ambulatory Visit (HOSPITAL_BASED_OUTPATIENT_CLINIC_OR_DEPARTMENT_OTHER): Payer: Medicare Other | Admitting: Certified Registered Nurse Anesthetist

## 2012-05-26 DIAGNOSIS — I10 Essential (primary) hypertension: Secondary | ICD-10-CM | POA: Insufficient documentation

## 2012-05-26 DIAGNOSIS — Z79899 Other long term (current) drug therapy: Secondary | ICD-10-CM | POA: Insufficient documentation

## 2012-05-26 DIAGNOSIS — M109 Gout, unspecified: Secondary | ICD-10-CM | POA: Insufficient documentation

## 2012-05-26 DIAGNOSIS — M24443 Recurrent dislocation, unspecified hand: Secondary | ICD-10-CM | POA: Insufficient documentation

## 2012-05-26 DIAGNOSIS — Z7901 Long term (current) use of anticoagulants: Secondary | ICD-10-CM | POA: Insufficient documentation

## 2012-05-26 DIAGNOSIS — Z7982 Long term (current) use of aspirin: Secondary | ICD-10-CM | POA: Insufficient documentation

## 2012-05-26 DIAGNOSIS — R413 Other amnesia: Secondary | ICD-10-CM | POA: Insufficient documentation

## 2012-05-26 DIAGNOSIS — Z8673 Personal history of transient ischemic attack (TIA), and cerebral infarction without residual deficits: Secondary | ICD-10-CM | POA: Insufficient documentation

## 2012-05-26 DIAGNOSIS — Z86711 Personal history of pulmonary embolism: Secondary | ICD-10-CM | POA: Insufficient documentation

## 2012-05-26 HISTORY — DX: Other amnesia: R41.3

## 2012-05-26 HISTORY — DX: Other pulmonary embolism without acute cor pulmonale: I26.99

## 2012-05-26 HISTORY — DX: Acute embolism and thrombosis of unspecified deep veins of unspecified lower extremity: I82.409

## 2012-05-26 HISTORY — DX: Gout, unspecified: M10.9

## 2012-05-26 HISTORY — DX: Unspecified osteoarthritis, unspecified site: M19.90

## 2012-05-26 HISTORY — DX: Presence of spectacles and contact lenses: Z97.3

## 2012-05-26 HISTORY — DX: Shortness of breath: R06.02

## 2012-05-26 HISTORY — DX: Essential (primary) hypertension: I10

## 2012-05-26 HISTORY — PX: REPAIR EXTENSOR TENDON: SHX5382

## 2012-05-26 SURGERY — REPAIR, TENDON, EXTENSOR
Anesthesia: Regional | Site: Hand | Laterality: Right | Wound class: Clean

## 2012-05-26 MED ORDER — ONDANSETRON HCL 4 MG/2ML IJ SOLN: 4.0000 mg | Freq: Once | INTRAMUSCULAR | Status: DC | PRN

## 2012-05-26 MED ORDER — HYDROCODONE-ACETAMINOPHEN 5-325 MG PO TABS: 1.0000 | ORAL_TABLET | Freq: Four times a day (QID) | ORAL | Status: DC | PRN

## 2012-05-26 MED ORDER — CHLORHEXIDINE GLUCONATE 4 % EX LIQD: 60.0000 mL | Freq: Once | CUTANEOUS | Status: DC

## 2012-05-26 MED ORDER — BUPIVACAINE-EPINEPHRINE PF 0.25-1:200000 % IJ SOLN
INTRAMUSCULAR | Status: DC | PRN
  Administered 2012-05-26: 3 mL

## 2012-05-26 MED ORDER — PROPOFOL 10 MG/ML IV EMUL
INTRAVENOUS | Status: DC | PRN
  Administered 2012-05-26: 75 ug/kg/min via INTRAVENOUS

## 2012-05-26 MED ORDER — FENTANYL CITRATE 0.05 MG/ML IJ SOLN
INTRAMUSCULAR | Status: DC | PRN
  Administered 2012-05-26: 25 ug via INTRAVENOUS

## 2012-05-26 MED ORDER — FENTANYL CITRATE 0.05 MG/ML IJ SOLN: 50.0000 ug | INTRAMUSCULAR | Status: DC | PRN

## 2012-05-26 MED ORDER — MIDAZOLAM HCL 2 MG/2ML IJ SOLN: 1.0000 mg | INTRAMUSCULAR | Status: DC | PRN

## 2012-05-26 MED ORDER — LIDOCAINE HCL (CARDIAC) 20 MG/ML IV SOLN
INTRAVENOUS | Status: DC | PRN
  Administered 2012-05-26: 30 mg via INTRAVENOUS

## 2012-05-26 MED ORDER — MEPERIDINE HCL 25 MG/ML IJ SOLN: 6.2500 mg | INTRAMUSCULAR | Status: DC | PRN

## 2012-05-26 MED ORDER — HYDROMORPHONE HCL PF 1 MG/ML IJ SOLN: 0.2500 mg | INTRAMUSCULAR | Status: DC | PRN

## 2012-05-26 MED ORDER — LACTATED RINGERS IV SOLN
INTRAVENOUS | Status: DC
  Administered 2012-05-26: 10:00:00 via INTRAVENOUS

## 2012-05-26 MED ORDER — 0.9 % SODIUM CHLORIDE (POUR BTL) OPTIME
TOPICAL | Status: DC | PRN
  Administered 2012-05-26: 200 mL

## 2012-05-26 MED ORDER — THROMBIN 20000 UNITS EX KIT
PACK | CUTANEOUS | Status: DC | PRN
  Administered 2012-05-26: 10000 [IU] via TOPICAL

## 2012-05-26 MED ORDER — OXYCODONE HCL 5 MG PO TABS: 5.0000 mg | ORAL_TABLET | Freq: Once | ORAL | Status: DC | PRN

## 2012-05-26 MED ORDER — CEFAZOLIN SODIUM-DEXTROSE 2-3 GM-% IV SOLR
2.0000 g | INTRAVENOUS | Status: AC
  Administered 2012-05-26: 2 g via INTRAVENOUS

## 2012-05-26 MED ORDER — LIDOCAINE HCL (PF) 0.5 % IJ SOLN
INTRAMUSCULAR | Status: DC | PRN
  Administered 2012-05-26: 30 mL via INTRAVENOUS

## 2012-05-26 MED ORDER — OXYCODONE HCL 5 MG/5ML PO SOLN: 5.0000 mg | Freq: Once | ORAL | Status: DC | PRN

## 2012-05-26 MED ORDER — ONDANSETRON HCL 4 MG/2ML IJ SOLN
INTRAMUSCULAR | Status: DC | PRN
  Administered 2012-05-26: 4 mg via INTRAVENOUS

## 2012-05-26 SURGICAL SUPPLY — 81 items
BAG DECANTER FOR FLEXI CONT (MISCELLANEOUS) IMPLANT
BALL CTTN LRG ABS STRL LF (GAUZE/BANDAGES/DRESSINGS)
BANDAGE GAUZE ELAST BULKY 4 IN (GAUZE/BANDAGES/DRESSINGS) ×2 IMPLANT
BLADE MINI RND TIP GREEN BEAV (BLADE) ×1 IMPLANT
BLADE SURG 15 STRL LF DISP TIS (BLADE) ×1 IMPLANT
BLADE SURG 15 STRL SS (BLADE) ×2
BNDG CMPR 9X4 STRL LF SNTH (GAUZE/BANDAGES/DRESSINGS)
BNDG COHESIVE 3X5 TAN STRL LF (GAUZE/BANDAGES/DRESSINGS) ×2 IMPLANT
BNDG ESMARK 4X9 LF (GAUZE/BANDAGES/DRESSINGS) IMPLANT
CHLORAPREP W/TINT 26ML (MISCELLANEOUS) ×2 IMPLANT
CLOTH BEACON ORANGE TIMEOUT ST (SAFETY) ×2 IMPLANT
CORDS BIPOLAR (ELECTRODE) ×2 IMPLANT
COTTONBALL LRG STERILE PKG (GAUZE/BANDAGES/DRESSINGS) IMPLANT
COVER MAYO STAND STRL (DRAPES) ×2 IMPLANT
COVER TABLE BACK 60X90 (DRAPES) ×2 IMPLANT
CUFF TOURNIQUET SINGLE 18IN (TOURNIQUET CUFF) ×1 IMPLANT
DECANTER SPIKE VIAL GLASS SM (MISCELLANEOUS) IMPLANT
DRAIN TLS ROUND 10FR (DRAIN) IMPLANT
DRAPE EXTREMITY T 121X128X90 (DRAPE) ×2 IMPLANT
DRAPE OEC MINIVIEW 54X84 (DRAPES) IMPLANT
DRAPE SURG 17X23 STRL (DRAPES) ×2 IMPLANT
DRSG KUZMA FLUFF (GAUZE/BANDAGES/DRESSINGS) ×1 IMPLANT
GAUZE SPONGE 4X4 16PLY XRAY LF (GAUZE/BANDAGES/DRESSINGS) IMPLANT
GAUZE XEROFORM 1X8 LF (GAUZE/BANDAGES/DRESSINGS) ×2 IMPLANT
GLOVE BIO SURGEON STRL SZ 6.5 (GLOVE) ×2 IMPLANT
GLOVE BIO SURGEON STRL SZ7.5 (GLOVE) ×1 IMPLANT
GLOVE BIOGEL PI IND STRL 7.0 (GLOVE) IMPLANT
GLOVE BIOGEL PI IND STRL 8 (GLOVE) IMPLANT
GLOVE BIOGEL PI IND STRL 8.5 (GLOVE) ×1 IMPLANT
GLOVE BIOGEL PI INDICATOR 7.0 (GLOVE) ×1
GLOVE BIOGEL PI INDICATOR 8 (GLOVE) ×1
GLOVE BIOGEL PI INDICATOR 8.5 (GLOVE) ×1
GLOVE ECLIPSE 6.5 STRL STRAW (GLOVE) ×1 IMPLANT
GLOVE SURG ORTHO 8.0 STRL STRW (GLOVE) ×2 IMPLANT
GOWN BRE IMP PREV XXLGXLNG (GOWN DISPOSABLE) ×3 IMPLANT
GOWN PREVENTION PLUS XLARGE (GOWN DISPOSABLE) ×3 IMPLANT
LOOP VESSEL MAXI BLUE (MISCELLANEOUS) IMPLANT
NDL KEITH (NEEDLE) IMPLANT
NEEDLE 27GAX1X1/2 (NEEDLE) ×1 IMPLANT
NEEDLE HYPO 22GX1.5 SAFETY (NEEDLE) IMPLANT
NEEDLE KEITH (NEEDLE) IMPLANT
NS IRRIG 1000ML POUR BTL (IV SOLUTION) ×2 IMPLANT
PACK BASIN DAY SURGERY FS (CUSTOM PROCEDURE TRAY) ×2 IMPLANT
PAD CAST 3X4 CTTN HI CHSV (CAST SUPPLIES) ×1 IMPLANT
PADDING CAST ABS 3INX4YD NS (CAST SUPPLIES)
PADDING CAST ABS 4INX4YD NS (CAST SUPPLIES) ×1
PADDING CAST ABS COTTON 3X4 (CAST SUPPLIES) IMPLANT
PADDING CAST ABS COTTON 4X4 ST (CAST SUPPLIES) ×1 IMPLANT
PADDING CAST COTTON 3X4 STRL (CAST SUPPLIES) ×2
SLEEVE SCD COMPRESS KNEE MED (MISCELLANEOUS) ×1 IMPLANT
SPLINT PLASTER CAST XFAST 3X15 (CAST SUPPLIES) IMPLANT
SPLINT PLASTER XTRA FASTSET 3X (CAST SUPPLIES) ×10
SPONGE GAUZE 4X4 12PLY (GAUZE/BANDAGES/DRESSINGS) ×2 IMPLANT
STOCKINETTE 4X48 STRL (DRAPES) ×2 IMPLANT
SUT CHROMIC 5 0 P 3 (SUTURE) IMPLANT
SUT ETHIBOND 3-0 V-5 (SUTURE) IMPLANT
SUT FIBERWIRE 2-0 18 17.9 3/8 (SUTURE)
SUT FIBERWIRE 4-0 18 TAPR NDL (SUTURE)
SUT MERSILENE 2.0 SH NDLE (SUTURE) IMPLANT
SUT MERSILENE 3 0 FS 1 (SUTURE) IMPLANT
SUT MERSILENE 4 0 P 3 (SUTURE) ×1 IMPLANT
SUT POLY BUTTON 15MM (SUTURE) IMPLANT
SUT PROLENE 2 0 SH DA (SUTURE) IMPLANT
SUT SILK 2 0 FS (SUTURE) IMPLANT
SUT SILK 4 0 PS 2 (SUTURE) IMPLANT
SUT STEEL 3 0 (SUTURE) IMPLANT
SUT STEEL 4 0 V 26 (SUTURE) IMPLANT
SUT VIC AB 3-0 PS1 18 (SUTURE)
SUT VIC AB 3-0 PS1 18XBRD (SUTURE) IMPLANT
SUT VIC AB 4-0 P-3 18XBRD (SUTURE) IMPLANT
SUT VIC AB 4-0 P3 18 (SUTURE)
SUT VICRYL 4-0 PS2 18IN ABS (SUTURE) IMPLANT
SUT VICRYL RAPID 5 0 P 3 (SUTURE) IMPLANT
SUT VICRYL RAPIDE 4/0 PS 2 (SUTURE) ×2 IMPLANT
SUTURE FIBERWR 2-0 18 17.9 3/8 (SUTURE) IMPLANT
SUTURE FIBERWR 4-0 18 TAPR NDL (SUTURE) IMPLANT
SYR BULB 3OZ (MISCELLANEOUS) ×2 IMPLANT
SYR CONTROL 10ML LL (SYRINGE) ×1 IMPLANT
TOWEL OR 17X24 6PK STRL BLUE (TOWEL DISPOSABLE) ×4 IMPLANT
TUBE FEEDING 5FR 15 INCH (TUBING) IMPLANT
UNDERPAD 30X30 INCONTINENT (UNDERPADS AND DIAPERS) ×2 IMPLANT

## 2012-05-26 NOTE — Anesthesia Preprocedure Evaluation (Addendum)
Anesthesia Evaluation  Patient identified by MRN, date of birth, ID band Patient awake    Reviewed: Allergy & Precautions, H&P , NPO status , Patient's Chart, lab work & pertinent test results  Airway Mallampati: I TM Distance: >3 FB Neck ROM: Full    Dental   Pulmonary          Cardiovascular hypertension, Pt. on medications     Neuro/Psych    GI/Hepatic   Endo/Other    Renal/GU CRFRenal diseaseCr stable in high 2's for a coup-loe of years     Musculoskeletal   Abdominal   Peds  Hematology   Anesthesia Other Findings   Reproductive/Obstetrics                          Anesthesia Physical Anesthesia Plan  ASA: II  Anesthesia Plan: Bier Block   Post-op Pain Management:    Induction: Intravenous  Airway Management Planned: Natural Airway  Additional Equipment:   Intra-op Plan:   Post-operative Plan: Extubation in OR  Informed Consent: I have reviewed the patients History and Physical, chart, labs and discussed the procedure including the risks, benefits and alternatives for the proposed anesthesia with the patient or authorized representative who has indicated his/her understanding and acceptance.     Plan Discussed with: CRNA and Surgeon  Anesthesia Plan Comments:         Anesthesia Quick Evaluation

## 2012-05-26 NOTE — Anesthesia Postprocedure Evaluation (Signed)
  Anesthesia Post-op Note  Patient: Glenn Hicks  Procedure(s) Performed: Procedure(s): RECONSTRUCTION EXTENSOR HOOD RIGHT MIDDLE FINGER (Right)  Patient Location: PACU  Anesthesia Type:Bier block  Level of Consciousness: awake, alert , oriented and patient cooperative  Airway and Oxygen Therapy: Patient Spontanous Breathing  Post-op Pain: none  Post-op Assessment: Post-op Vital signs reviewed, Patient's Cardiovascular Status Stable, Respiratory Function Stable, Patent Airway, No signs of Nausea or vomiting and Pain level controlled  Post-op Vital Signs: Reviewed and stable  Complications: No apparent anesthesia complications

## 2012-05-26 NOTE — Brief Op Note (Signed)
05/26/2012  12:29 PM  PATIENT:  Glenn Hicks  79 y.o. male  PRE-OPERATIVE DIAGNOSIS:  SUBLUXATION EXTENSOR TENDON RIGHT MIDDLE FINGER  POST-OPERATIVE DIAGNOSIS:  SUBLUXATION EXTENSOR TENDON RIGHT MIDDLE FINGER  PROCEDURE:  Procedure(s): RECONSTRUCTION EXTENSOR HOOD RIGHT MIDDLE FINGER (Right)  SURGEON:  Surgeon(s) and Role:    * Wynonia Sours, MD - Primary  PHYSICIAN ASSISTANT:   ASSISTANTS: K Javanni Maring,MD   ANESTHESIA:   local and regional  EBL:     BLOOD ADMINISTERED:none  DRAINS: none   LOCAL MEDICATIONS USED:  MARCAINE     SPECIMEN:  No Specimen  DISPOSITION OF SPECIMEN:  N/A  COUNTS:  YES  TOURNIQUET:   Total Tourniquet Time Documented: Forearm (Right) - 33 minutes Total: Forearm (Right) - 33 minutes   DICTATION: .Other Dictation: Dictation Number [id number]  PLAN OF CARE: Discharge to home after PACU  PATIENT DISPOSITION:  PACU - hemodynamically stable.

## 2012-05-26 NOTE — Op Note (Signed)
Dictation Number [id number]

## 2012-05-26 NOTE — Discharge Instructions (Addendum)

## 2012-05-26 NOTE — Transfer of Care (Signed)
Immediate Anesthesia Transfer of Care Note  Patient: Glenn Hicks  Procedure(s) Performed: Procedure(s): RECONSTRUCTION EXTENSOR HOOD RIGHT MIDDLE FINGER (Right)  Patient Location: PACU  Anesthesia Type:Bier block  Level of Consciousness: awake, alert , oriented and patient cooperative  Airway & Oxygen Therapy: Patient Spontanous Breathing and Patient connected to face mask oxygen  Post-op Assessment: Report given to PACU RN and Post -op Vital signs reviewed and stable  Post vital signs: Reviewed and stable  Complications: No apparent anesthesia complications

## 2012-05-26 NOTE — Anesthesia Procedure Notes (Addendum)
Anesthesia Regional Block:  Bier block (IV Regional)  Pre-Anesthetic Checklist: ,, timeout performed, Correct Patient, Correct Site, Correct Laterality, Correct Procedure,, site marked, surgical consent,, at surgeon's request Needles:  Injection technique: Single-shot  Needle Type: Other      Needle Gauge: 20 and 20 G    Additional Needles: Bier block (IV Regional) Narrative:   Performed by: Personally   Bier block (IV Regional) Procedure Name: MAC Date/Time: 05/26/2012 11:50 AM Performed by: Markeia Harkless D Pre-anesthesia Checklist: Patient identified, Emergency Drugs available, Suction available, Patient being monitored and Timeout performed Patient Re-evaluated:Patient Re-evaluated prior to inductionOxygen Delivery Method: Simple face mask

## 2012-05-26 NOTE — H&P (Signed)
Glenn Hicks is an 79 y.o. male.   Chief Complaint: Glenn Hicks is an 79 year old right hand dominant male  with  triggering of his right middle finger with dislocation of the extensor tendon ulnar gutter. This has been going on for approximately 10 days. He has no history of injury. He complains of pain with grip. He has a history of gout. He has no history of diabetes, thyroid problems, or arthritis. He complains of a constant, moderate sharp pain when he flexes his finger and attempts to extend it. Rest makes it better and activity makes it worse. He has no problems on his left side. He has a subluxating extensor tendon right middle finger.  This has recurred following splinting .  We have discussed with him the reconstruction with recentralization.  The pre, peri and postoperative course were discussed along with the risks and complications.  The patient is aware there is no guarantee with the surgery, possibility of infection, recurrence, injury to arteries, nerves, tendons, incomplete relief of symptoms and dystrophy.  The possibility of recurrence.  PAST MEDICAL HISTORY: He has no known drug allergies. He is on aspirin, Allopurinol, Colcrys, Triamcinolone, Losartan and Lasix. His INR generally runs 2.7. He has had back surgery, hernia surgery.  FAMILY H ISTORY: Negative.  SOCIAL HISTORY: He does not smoke or drink. He is married and retired.   REVIEW OF SYSTEMS: Positive for easy bleeding, otherwise negative. HPI: see above  Past Medical History  Diagnosis Date  . Hypertension   . Gout   . DVT (deep venous thrombosis)     hx  . PE (pulmonary embolism)     hx  . Wears glasses   . Shortness of breath   . Memory deficit   . Arthritis     Past Surgical History  Procedure Laterality Date  . Back surgery  1990    lumb lam  . Umbilical hernia repair  2008  . Cholecystectomy  2008    lap choli with umb HR  . Tendon repair  1990    left arm  . Vocal cord lateralization,  endoscopic approach w/ mlb      polyp  . Colonoscopy    . Upper gi endoscopy    . Tonsillectomy      History reviewed. No pertinent family history. Social History:  reports that he has never smoked. He does not have any smokeless tobacco history on file. He reports that he does not drink alcohol or use illicit drugs.  Allergies: No Known Allergies  Medications Prior to Admission  Medication Sig Dispense Refill  . allopurinol (ZYLOPRIM) 100 MG tablet Take 100 mg by mouth daily.      Marland Kitchen aspirin 81 MG tablet Take 81 mg by mouth daily.      . colchicine 0.6 MG tablet Take 0.6 mg by mouth as needed.      . furosemide (LASIX) 20 MG tablet Take 20 mg by mouth daily.      Marland Kitchen losartan (COZAAR) 50 MG tablet Take 50 mg by mouth daily.      . memantine (NAMENDA) 10 MG tablet Take 10 mg by mouth every evening.      . triamcinolone lotion (KENALOG) 0.1 % Apply topically 3 (three) times daily.      Marland Kitchen warfarin (COUMADIN) 3 MG tablet Take 3 mg by mouth daily. Takes 1/2 Monday and friday        Results for orders placed during the hospital encounter of 05/26/12 (from  the past 48 hour(s))  BASIC METABOLIC PANEL     Status: Abnormal   Collection Time    05/25/12 12:00 PM      Result Value Range   Sodium 135  135 - 145 mEq/L   Potassium 4.3  3.5 - 5.1 mEq/L   Chloride 101  96 - 112 mEq/L   CO2 24  19 - 32 mEq/L   Glucose, Bld 87  70 - 99 mg/dL   BUN 42 (*) 6 - 23 mg/dL   Creatinine, Ser 2.51 (*) 0.50 - 1.35 mg/dL   Calcium 10.2  8.4 - 10.5 mg/dL   GFR calc non Af Amer 22 (*) >90 mL/min   GFR calc Af Amer 26 (*) >90 mL/min   Comment:            The eGFR has been calculated     using the CKD EPI equation.     This calculation has not been     validated in all clinical     situations.     eGFR's persistently     <90 mL/min signify     possible Chronic Kidney Disease.  PROTIME-INR     Status: None   Collection Time    05/25/12 12:00 PM      Result Value Range   Prothrombin Time 13.8  11.6 -  15.2 seconds   INR 1.07  0.00 - 1.49  APTT     Status: None   Collection Time    05/25/12 12:00 PM      Result Value Range   aPTT 26  24 - 37 seconds    No results found.   Pertinent items are noted in HPI.  Blood pressure 136/86, pulse 69, temperature 97.9 F (36.6 C), temperature source Oral, resp. rate 18, height 5\' 9"  (1.753 m), weight 77.021 kg (169 lb 12.8 oz), SpO2 97.00%.  General appearance: alert, cooperative and appears stated age Head: Normocephalic, without obvious abnormality Neck: no JVD Resp: clear to auscultation bilaterally Cardio: regular rate and rhythm, S1, S2 normal, no murmur, click, rub or gallop GI: soft, non-tender; bowel sounds normal; no masses,  no organomegaly Extremities: extremities normal, atraumatic, no cyanosis or edema Pulses: 2+ and symmetric Skin: Skin color, texture, turgor normal. No rashes or lesions Neurologic: Grossly normal Incision/Wound: na  Assessment/Plan He will be scheduled for reconstruction extensor hood centralization extensor tendon right middle finger as an outpatient under regional anesthesia .   Glenn Hicks R 05/26/2012, 10:45 AM

## 2012-05-27 ENCOUNTER — Encounter (HOSPITAL_BASED_OUTPATIENT_CLINIC_OR_DEPARTMENT_OTHER): Payer: Self-pay | Admitting: Orthopedic Surgery

## 2012-05-27 NOTE — Op Note (Signed)
[name], [name] NO.:  [id number]  MEDICAL RECORD NO.:  [id number]  LOCATION:                                 FACILITY:  PHYSICIAN:  Daryll Brod, M.D.            DATE OF BIRTH:  DATE OF PROCEDURE:  05/26/2012 DATE OF DISCHARGE:                              OPERATIVE REPORT   PREOPERATIVE DIAGNOSIS:  Ruptured sagittal fibers, right middle finger.  POSTOPERATIVE DIAGNOSIS:  Ruptured sagittal fibers, right middle finger.  OPERATION:  Reconstruction sagittal fibers, centralization extensor tendon, right middle finger.  SURGEON:  Daryll Brod, M.D.  ASSISTANT:  Leanora Cover, MD  ANESTHESIA:  Forearm IV regional with local infiltration.  ANESTHESIOLOGIST:  Crissie Sickles. Conrad Ector, M.D.  HISTORY:  The patient is an 79 year old male with a history of subluxation of the extensor tendon, right middle finger.  This has not responded to conservative treatment.  He was placed in the U splint that is centralized, however, with mobilization following this, __________ re- subluxated.  He is admitted now for reconstruction of the sagittal fibers, right middle finger.  Pre, peri, and postoperative course have been discussed along with risks and complications.  He is aware that there is no guarantee with the surgery; possibility of infection; recurrence of injury to arteries, nerves, tendons, incomplete relief of symptoms, dystrophy.  In the preoperative area, the patient is seen, the extremity marked by both the patient and surgeon.  Antibiotic given.  PROCEDURE IN DETAIL:  The patient was brought to the operating room where a forearm-based IV regional anesthetic was carried out without difficulty.  He was prepped using ChloraPrep, supine position, right arm free.  A 3-minute dry time was allowed.  Time-out taken, confirming the patient and procedure.  A longitudinal incision was made over the metacarpophalangeal joint of his right middle finger, carried down through  subcutaneous tissue.  Bleeders were electrocauterized.  The dissection was carried down to the extensor tendon.  The juncture to the ring finger was harvested.  A portion of the extensor tendon was included.  This was brought distally.  This was passed through the extensor tendon around the intermetacarpal ligament back onto itself, sutured into position with multiple 4-0 figure-of-eight M[place]il[place] sutures.  Full flexion, extension with no subluxation was noted.  The wound was copiously irrigated with saline, sprayed with thrombin spray, and the wound was closed with interrupted 4-0 Vicryl Rapide sutures.  A local infiltration with 0.25% Marcaine with epinephrine was given, approximately 3-4 mL was used.  A sterile compressive dressing, forearm based splint to all of his fingers was applied.  On deflation of the tourniquet, all fingers immediately pinked.  He was taken to the recovery room for observation in satisfactory condition.  He will be discharged home to return to the [facility] in 1 week on Norco.  He will resume his Coumadin.  INR yesterday was 1.7.          ______________________________ Daryll Brod, M.D.     GK/MEDQ  D:  05/26/2012  T:  05/27/2012  Job:  [id number]

## 2013-05-18 ENCOUNTER — Ambulatory Visit (HOSPITAL_BASED_OUTPATIENT_CLINIC_OR_DEPARTMENT_OTHER)
Admission: RE | Admit: 2013-05-18 | Discharge: 2013-05-18 | Disposition: A | Discharge status: Home / Self Care | Payer: Medicare Other | Source: Ambulatory Visit | Attending: Family Medicine | Admitting: Family Medicine

## 2013-05-18 ENCOUNTER — Other Ambulatory Visit (HOSPITAL_BASED_OUTPATIENT_CLINIC_OR_DEPARTMENT_OTHER): Payer: Self-pay | Admitting: Family Medicine

## 2013-05-18 DIAGNOSIS — W19XXXA Unspecified fall, initial encounter: Secondary | ICD-10-CM | POA: Insufficient documentation

## 2013-05-18 DIAGNOSIS — R42 Dizziness and giddiness: Secondary | ICD-10-CM | POA: Insufficient documentation

## 2013-05-18 DIAGNOSIS — Y929 Unspecified place or not applicable: Secondary | ICD-10-CM | POA: Insufficient documentation

## 2013-05-18 DIAGNOSIS — G319 Degenerative disease of nervous system, unspecified: Secondary | ICD-10-CM | POA: Insufficient documentation

## 2013-08-27 ENCOUNTER — Encounter: Payer: Self-pay | Admitting: *Deleted

## 2014-03-17 ENCOUNTER — Other Ambulatory Visit: Payer: Self-pay | Admitting: Family Medicine

## 2016-10-22 ENCOUNTER — Ambulatory Visit (HOSPITAL_BASED_OUTPATIENT_CLINIC_OR_DEPARTMENT_OTHER)
Admission: RE | Admit: 2016-10-22 | Discharge: 2016-10-22 | Disposition: A | Discharge status: Home / Self Care | Payer: Medicare Other | Source: Ambulatory Visit | Attending: Physician Assistant | Admitting: Physician Assistant

## 2016-10-22 ENCOUNTER — Other Ambulatory Visit (HOSPITAL_BASED_OUTPATIENT_CLINIC_OR_DEPARTMENT_OTHER): Payer: Self-pay | Admitting: Physician Assistant

## 2016-10-22 DIAGNOSIS — I739 Peripheral vascular disease, unspecified: Secondary | ICD-10-CM | POA: Insufficient documentation

## 2016-10-22 DIAGNOSIS — G9389 Other specified disorders of brain: Secondary | ICD-10-CM | POA: Diagnosis not present

## 2016-10-22 DIAGNOSIS — R42 Dizziness and giddiness: Secondary | ICD-10-CM

## 2016-12-04 ENCOUNTER — Ambulatory Visit (HOSPITAL_BASED_OUTPATIENT_CLINIC_OR_DEPARTMENT_OTHER)
Admission: RE | Admit: 2016-12-04 | Discharge: 2016-12-04 | Disposition: A | Discharge status: Home / Self Care | Payer: Medicare Other | Source: Ambulatory Visit | Attending: Physician Assistant | Admitting: Physician Assistant

## 2016-12-04 ENCOUNTER — Other Ambulatory Visit (HOSPITAL_BASED_OUTPATIENT_CLINIC_OR_DEPARTMENT_OTHER): Payer: Self-pay | Admitting: Physician Assistant

## 2016-12-04 DIAGNOSIS — W19XXXA Unspecified fall, initial encounter: Secondary | ICD-10-CM

## 2016-12-04 DIAGNOSIS — G319 Degenerative disease of nervous system, unspecified: Secondary | ICD-10-CM | POA: Diagnosis not present

## 2016-12-04 DIAGNOSIS — M47892 Other spondylosis, cervical region: Secondary | ICD-10-CM | POA: Insufficient documentation

## 2016-12-04 DIAGNOSIS — I6782 Cerebral ischemia: Secondary | ICD-10-CM | POA: Insufficient documentation

## 2016-12-12 ENCOUNTER — Encounter: Payer: Self-pay | Admitting: Neurology

## 2016-12-12 ENCOUNTER — Ambulatory Visit: Payer: Medicare Other | Admitting: Neurology

## 2016-12-12 VITALS — Ht 69.0 in | Wt 181.0 lb

## 2016-12-12 DIAGNOSIS — R55 Syncope and collapse: Secondary | ICD-10-CM | POA: Diagnosis not present

## 2016-12-12 DIAGNOSIS — R419 Unspecified symptoms and signs involving cognitive functions and awareness: Secondary | ICD-10-CM

## 2016-12-12 DIAGNOSIS — N184 Chronic kidney disease, stage 4 (severe): Secondary | ICD-10-CM | POA: Diagnosis not present

## 2016-12-12 NOTE — Patient Instructions (Addendum)
MRI brain eeg   Syncope Syncope is when you temporarily lose consciousness. Syncope may also be called fainting or passing out. It is caused by a sudden decrease in blood flow to the brain. Even though most causes of syncope are not dangerous, syncope can be a sign of a serious medical problem. Signs that you may be about to faint include:  Feeling dizzy or light-headed.  Feeling nauseous.  Seeing all white or all black in your field of vision.  Having cold, clammy skin.  If you fainted, get medical help right away.Call your local emergency services (911 in the [place] Do not drive yourself to the hospital. Follow these instructions at home: Pay attention to any changes in your symptoms. Take these actions to help with your condition:  Have someone stay with you until you feel stable.  Do not drive, use machinery, or play sports until your health care provider says it is okay.  Keep all follow-up visits as told by your health care provider. This is important.  If you start to feel like you might faint, lie down right away and raise (elevate) your feet above the level of your heart. Breathe deeply and steadily. Wait until all of the symptoms have passed.  Drink enough fluid to keep your urine clear or pale yellow.  If you are taking blood pressure or heart medicine, get up slowly and take several minutes to sit and then stand. This can reduce dizziness.  Take over-the-counter and prescription medicines only as told by your health care provider.  Get help right away if:  You have a severe headache.  You have unusual pain in your chest, abdomen, or back.  You are bleeding from your mouth or rectum, or you have black or tarry stool.  You have a very fast or irregular heartbeat (palpitations).  You have pain with breathing.  You faint once or repeatedly.  You have a seizure.  You are confused.  You have trouble walking.  You have severe weakness.  You have vision  problems. These symptoms may represent a serious problem that is an emergency. Do not wait to see if your symptoms will go away. Get medical help right away. Call your local emergency services (911 in the [place] Do not drive yourself to the hospital. This information is not intended to replace advice given to you by your health care provider. Make sure you discuss any questions you have with your health care provider. Document Released: 12/31/2004 Document Revised: 06/08/2015 Document Reviewed: 09/14/2014 Elsevier Interactive Patient Education  2017 [employer].   Seizure, Adult When you have a seizure:  Parts of your body may move.  How aware or awake (conscious) you are may change.  You may shake (convulse).  Some people have symptoms right before a seizure happens. These symptoms may include:  Fear.  Worry (anxiety).  Feeling like you are going to throw up (nausea).  Feeling like the room is spinning (vertigo).  Feeling like you saw or heard something before (deja vu).  Odd tastes or smells.  Changes in vision, such as seeing flashing lights or spots.  Seizures usually last from 30 seconds to 2 minutes. Usually, they are not harmful unless they last a long time. Follow these instructions at home: Medicines  Take over-the-counter and prescription medicines only as told by your doctor.  Avoid anything that may keep your medicine from working, such as alcohol. Activity  Do not do any activities that would be dangerous if you  had another seizure, like driving or swimming. Wait until your doctor approves.  If you live in the [place] ask your local DMV (department of motor vehicles) when you can drive.  Rest. Teaching others  Teach friends and family what to do when you have a seizure. They should: ? Lay you on the ground. ? Protect your head and body. ? Loosen any tight clothing around your neck. ? Turn you on your side. ? Stay with you until you are better. ? Not  hold you down. ? Not put anything in your mouth. ? Know whether or not you need emergency care. General instructions  Contact your doctor each time you have a seizure.  Avoid anything that gives you seizures.  Keep a seizure diary. Write down: ? What you think caused each seizure. ? What you remember about each seizure.  Keep all follow-up visits as told by your doctor. This is important. Contact a doctor if:  You have another seizure.  You have seizures more often.  There is any change in what happens during your seizures.  You continue to have seizures with treatment.  You have symptoms of being sick or having an infection. Get help right away if:  You have a seizure: ? That lasts longer than 5 minutes. ? That is different than seizures you had before. ? That makes it harder to breathe. ? After you hurt your head.  After a seizure, you cannot speak or use a part of your body.  After a seizure, you are confused or have a bad headache.  You have two or more seizures in a row.  You are having seizures more often.  You do not wake up right after a seizure.  You get hurt during a seizure. In an emergency:  These symptoms may be an emergency. Do not wait to see if the symptoms will go away. Get medical help right away. Call your local emergency services (911 in the [place] Do not drive yourself to the hospital. This information is not intended to replace advice given to you by your health care provider. Make sure you discuss any questions you have with your health care provider. Document Released: 06/19/2007 Document Revised: 09/13/2015 Document Reviewed: 09/13/2015 Elsevier Interactive Patient Education  2017 [employer].

## 2016-12-12 NOTE — Progress Notes (Signed)
[facility]    Provider:  Dr Jaynee Eagles Referring Provider: Orpah Melter, MD Primary Care Physician:  Orpah Melter, MD  CC: Syncopal episode  HPI:  Glenn Hicks is a 83 y.o. male here as a referral from Dr. Olen Pel for syncopal event.  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 His son Glenn Hicks is here and provides information. Patient says he stood for a song, he felt dizzy and then he just "passed out". When he sat back down. A fireman in the church helped him out but the story is unclear, patient is a poor historian. The EMTs came and he never went to the emergency, they said he was dehydrated and did not take him to the emergency room. He does not drink enough water. He is trying to drink more. 4 years ago they were at a cookout and he passed out and again they diagnosed dehydration. Son says he does not drink a lot of fluids. No abnormal movements. No post-ictal confusion. He urinated on himself. No tongue biting.  Reviewed notes, labs and imaging from outside physicians, which showed:  Reviewed primary care notes.  Urinated on himself.  Possible seizure.  This happened in church October 20, 2016.  He was sitting and leaning back when he lost consciousness.  He did not fall.  His wife was talking to him.  He was restless during the service before he passed out.  He was out for probably a minute or 2.  He urinated on himself while unconscious.  No seizure-like activity.  No change in medication.  Had a similar incident July 4 about 4 years previous.  He was worked up with a CT scan that was normal and his heart was fine.  CMP October 21, 2016 showed a BUN of 49 and a creatinine of 2.44, EGFR 25 for non-African Americans and 31 for African-Americans,  potassium 5.9 otherwise CMP normal, CBC showed anemia 10.9/32.6 with an elevated MCV otherwise unremarkable.  Review of Systems: Patient complains of symptoms per HPI as well as the following symptoms: passing out. Pertinent negatives and positives per HPI. All others negative.   Social History   Socioeconomic History  . Marital status: Married    Spouse name: Not on file  . Number of children: 2  . Years of education: 89  . Highest education level: Not on file  Social Needs  . Financial resource strain: Not on file  . Food insecurity - worry: Not on file  . Food insecurity - inability: Not on file  . Transportation needs - medical: Not on file  . Transportation needs - non-medical: Not on file  Occupational History  . Not on file  Tobacco Use  . Smoking status: Never Smoker  . Smokeless tobacco: Never Used  Substance and Sexual Activity  . Alcohol use: No  . Drug use: No  . Sexual activity: Not on file  Other Topics Concern  . Not on file  Social History Narrative   Lives at home with his wife   Right handed   1 cup of caffeine daily    Family History  Problem Relation Age of Onset  . Lung cancer Mother   . Stroke Father   . Lung cancer Sister     Past Medical History:  Diagnosis Date  . Aortic atherosclerosis ([place])   .  Arthritis   . Chronic kidney disease   . DVT (deep venous thrombosis) ([facility])    hx  . Gout   . Hypertension   . Low back pain   . Memory deficit   . PE (pulmonary embolism)    hx  . Shortness of breath   . Vitamin D deficiency   . Wears glasses     Past Surgical History:  Procedure Laterality Date  . BACK SURGERY  1990   lumb lam  . CHOLECYSTECTOMY  2008   lap choli with umb HR  . COLONOSCOPY    . REPAIR EXTENSOR TENDON Right 05/26/2012   Procedure: RECONSTRUCTION EXTENSOR HOOD RIGHT MIDDLE FINGER;  Surgeon: Wynonia Sours, MD;  Location: [facility];  Service: Orthopedics;  Laterality: Right;  . TENDON REPAIR  1990    left arm  . TONSILLECTOMY    . UMBILICAL HERNIA REPAIR  2008  . UPPER GI ENDOSCOPY    . VOCAL CORD LATERALIZATION, ENDOSCOPIC APPROACH W/ MLB     polyp    Current Outpatient Medications  Medication Sig Dispense Refill  . allopurinol (ZYLOPRIM) 100 MG tablet Take 100 mg by mouth daily.    Marland Kitchen b complex vitamins capsule Take 1 capsule by mouth daily.    . Cholecalciferol (VITAMIN D3 PO) Take 2,000 Units by mouth daily.    . colchicine 0.6 MG tablet Take 0.6 mg by mouth as needed.    . furosemide (LASIX) 20 MG tablet Take 20 mg by mouth daily.    Marland Kitchen losartan (COZAAR) 25 MG tablet Take 25 mg by mouth daily.    . tamsulosin (FLOMAX) 0.4 MG CAPS capsule Take 1 capsule by mouth daily.    Marland Kitchen warfarin (COUMADIN) 3 MG tablet Take 3 mg by mouth daily. Takes 1/2 Monday and friday    . triamcinolone lotion (KENALOG) 0.1 % Apply topically 2 (two) times daily as needed.      No current facility-administered medications for this visit.     Allergies as of 12/12/2016 - Review Complete 08/27/2013  Allergen Reaction Noted  . Galantamine  05/15/2015  . Memantine  06/16/2012    Vitals: Ht _0  (1.753 m)   Wt 181 lb (82.1 kg)   BMI 26.73 kg/m  Last Weight:  Wt Readings from Last 1 Encounters:  12/12/16 181 lb (82.1 kg)   Last Height:   Ht Readings from Last 1 Encounters:  12/12/16 _1  (1.753 m)    Physical exam: Exam: Gen: NAD                CV: RRR, no MRG. No Carotid Bruits. No peripheral edema, warm, nontender Eyes: Conjunctivae clear without exudates or hemorrhage  Neuro: Detailed Neurologic Exam  Speech:    Speech is normal; fluent and spontaneous with impaired comprehension.  Cognition:    The patient is oriented to person, place, and time;     recent and remote memory impaired ;     language fluent;     Impaired attention, concentration, fund of knowledge Cranial Nerves:    The pupils are equal, round, and reactive to light. Attempted fundoscopic exam could not visualize.  Visual fields are full to finger confrontation. Extraocular movements are intact. Trigeminal sensation is intact and the muscles of mastication are normal. The face is symmetric. The palate elevates in the midline. Hearing intact. Voice is normal. Shoulder shrug is normal. The tongue has normal motion without fasciculations.   Coordination:    No dysmetria  Gait:     Wide based, small steps, erect, good arm swing   Motor Observation:    No asymmetry, no atrophy, and no involuntary movements noted. Tone:    Normal muscle tone.    Posture:    Slightly stooped    Strength:    Strength is V/V in the upper and lower limbs.      Sensation: intact to LT     Reflex Exam:  DTR's:    Hypo ajs otherwise brisk symmetric reflexes  Toes:    The toes are equivocal bilaterally.   Clonus:    Clonus is absent.    Assessment/Plan:   83 y.o. male here as a referral from Dr. Olen Pel for syncopal event.  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 Syncopal event in the setting of dehydration and decreased fluids. My clinical suspicion for seizure is low. Discussed proper hydration.   MRI brain and EEG  Orders Placed This Encounter  Procedures  . MR BRAIN WO CONTRAST  . EEG   Patient is unable to drive, operate heavy machinery, perform activities at heights or participate in water activities until 6 months syncope free  Sarina Ill, MD  [facility] [address] [place], [place] [address]  Phone [phone] Fax [phone]

## 2016-12-15 DIAGNOSIS — N184 Chronic kidney disease, stage 4 (severe): Secondary | ICD-10-CM | POA: Insufficient documentation

## 2016-12-15 DIAGNOSIS — N189 Chronic kidney disease, unspecified: Secondary | ICD-10-CM | POA: Insufficient documentation

## 2016-12-15 DIAGNOSIS — R55 Syncope and collapse: Secondary | ICD-10-CM | POA: Insufficient documentation

## 2016-12-24 ENCOUNTER — Other Ambulatory Visit: Payer: Medicare Other

## 2016-12-28 ENCOUNTER — Ambulatory Visit (HOSPITAL_BASED_OUTPATIENT_CLINIC_OR_DEPARTMENT_OTHER)
Admission: RE | Admit: 2016-12-28 | Discharge: 2016-12-28 | Disposition: A | Discharge status: Home / Self Care | Payer: Medicare Other | Source: Ambulatory Visit | Attending: Neurology | Admitting: Neurology

## 2016-12-28 DIAGNOSIS — R9089 Other abnormal findings on diagnostic imaging of central nervous system: Secondary | ICD-10-CM | POA: Insufficient documentation

## 2016-12-28 DIAGNOSIS — R419 Unspecified symptoms and signs involving cognitive functions and awareness: Secondary | ICD-10-CM | POA: Diagnosis not present

## 2016-12-28 DIAGNOSIS — R55 Syncope and collapse: Secondary | ICD-10-CM | POA: Diagnosis not present

## 2016-12-31 ENCOUNTER — Telehealth: Payer: Self-pay | Admitting: *Deleted

## 2016-12-31 NOTE — Telephone Encounter (Signed)
I would do the EEG just for completeness.

## 2016-12-31 NOTE — Telephone Encounter (Signed)
Called and spoke with pt's son Glenn Hicks (on [place]). He verbalized understanding of MRI results. He asked that since everything has come back negative is the EEG still necessary? He understands they are two different tests. He does not want to overlook anything either. I encouraged to proceed with all testing but told him I would check with Dr. Jaynee Eagles and call him back.

## 2016-12-31 NOTE — Telephone Encounter (Signed)
Called and spoke with pt's son [name] (on [place]). Informed him that Dr. Jaynee Eagles suggests to do the EEG for completeness. He verbalized understanding and said they will be here Thursday for the EEG.

## 2016-12-31 NOTE — Telephone Encounter (Signed)
-----   Message from Melvenia Beam, MD sent at 12/30/2016  4:37 PM EST ----- No cause for his syncope seen, no strokes or masses or lesions. His brain shows some atrophy which can be seen with aging or memory loss (or both).  thanks

## 2017-01-01 ENCOUNTER — Other Ambulatory Visit: Payer: Medicare Other

## 2017-01-02 ENCOUNTER — Ambulatory Visit: Payer: Medicare Other | Admitting: Neurology

## 2017-01-02 DIAGNOSIS — R55 Syncope and collapse: Secondary | ICD-10-CM

## 2017-01-02 NOTE — Procedures (Signed)
      History: Josip Merolla is an 83 year old gentleman who is being evaluated for a syncopal event.  The patient has hypertension and stage IV renal disease.  The patient recently had an event where he felt dizzy and then blacked out.  The patient did not have jerking or twitching during the event.  He is being evaluated for the above blackout event.  This is a routine EEG.  No skull defects are noted.  Medications include allopurinol, vitamin D, colchicine, Lasix, Cozaar, Flomax, and Coumadin.  EEG classification: Dysrhythmia grade 1 generalized  Description of the recording: The background rhythms of this recording consists of a fairly well modulated medium amplitude rhythm of 7 Hz that is reactive to eye opening and closure.  As the record progresses, the patient appears to remain in the waking state throughout the recording.  Photic stimulation is performed and does result in a bilateral and symmetric photic driving response.  Hyperventilation is performed and results in a minimal buildup of background rhythm activities without significant slowing seen.  At no time during the recording does there appear to be evidence of spike or spike-wave discharges or evidence of focal slowing.  EKG monitor shows no evidence of cardiac rhythm abnormalities with a heart rate of 60.  Impression: This is an abnormal EEG recording secondary to diffuse background slowing.  This is a nonspecific recording, this can be seen in any process that results in a mild toxic or metabolic encephalopathy or any dementing type illness.  No evidence of epileptiform discharges were seen.

## 2017-01-03 ENCOUNTER — Telehealth: Payer: Self-pay | Admitting: *Deleted

## 2017-01-03 NOTE — Telephone Encounter (Addendum)
Called and spoke with pt's son [name] (on [place]). I informed him of his father Glenn Hicks's EEG results. He verbalized understanding that the EEG did not show any seizure/epileptiform activity. It did show some generalized slowing which can be seen in sleep, dementia, confusion or other non specific causes, but nothing to suggest seizures. He had no questions and was appreciative of the call.     ----- Message from Melvenia Beam, MD sent at 01/02/2017  5:43 PM EST ----- EEG did not show any seizure activity/epileptiform activity. It showed some generalized slowing which can be seen in sleep, dementia, confusion or other non specific causes, but nothing to suggest seizures.

## 2017-01-17 ENCOUNTER — Encounter: Payer: Self-pay | Admitting: Neurology

## 2017-01-30 ENCOUNTER — Other Ambulatory Visit: Payer: Self-pay

## 2017-01-30 ENCOUNTER — Emergency Department (HOSPITAL_COMMUNITY)
Admission: EM | Admit: 2017-01-30 | Discharge: 2017-01-30 | Disposition: A | Discharge status: Home / Self Care | Payer: Medicare Other | Attending: Emergency Medicine | Admitting: Emergency Medicine

## 2017-01-30 ENCOUNTER — Encounter (HOSPITAL_COMMUNITY): Payer: Self-pay

## 2017-01-30 ENCOUNTER — Emergency Department (HOSPITAL_COMMUNITY): Payer: Medicare Other

## 2017-01-30 DIAGNOSIS — R42 Dizziness and giddiness: Secondary | ICD-10-CM

## 2017-01-30 DIAGNOSIS — I129 Hypertensive chronic kidney disease with stage 1 through stage 4 chronic kidney disease, or unspecified chronic kidney disease: Secondary | ICD-10-CM | POA: Diagnosis not present

## 2017-01-30 DIAGNOSIS — N189 Chronic kidney disease, unspecified: Secondary | ICD-10-CM | POA: Insufficient documentation

## 2017-01-30 DIAGNOSIS — Z86718 Personal history of other venous thrombosis and embolism: Secondary | ICD-10-CM | POA: Diagnosis not present

## 2017-01-30 DIAGNOSIS — R296 Repeated falls: Secondary | ICD-10-CM | POA: Insufficient documentation

## 2017-01-30 DIAGNOSIS — Z79899 Other long term (current) drug therapy: Secondary | ICD-10-CM | POA: Diagnosis not present

## 2017-01-30 DIAGNOSIS — Z7901 Long term (current) use of anticoagulants: Secondary | ICD-10-CM | POA: Diagnosis not present

## 2017-01-30 DIAGNOSIS — Z86711 Personal history of pulmonary embolism: Secondary | ICD-10-CM | POA: Insufficient documentation

## 2017-01-30 LAB — URINALYSIS, ROUTINE W REFLEX MICROSCOPIC
Bilirubin Urine: NEGATIVE
Glucose, UA: 50 mg/dL — AB
Hgb urine dipstick: NEGATIVE
Ketones, ur: NEGATIVE mg/dL
Leukocytes, UA: NEGATIVE
Nitrite: NEGATIVE
Protein, ur: NEGATIVE mg/dL
Specific Gravity, Urine: 1.008 (ref 1.005–1.030)
pH: 6 (ref 5.0–8.0)

## 2017-01-30 LAB — BASIC METABOLIC PANEL
Anion gap: 6 (ref 5–15)
BUN: 41 mg/dL — ABNORMAL HIGH (ref 6–20)
CO2: 25 mmol/L (ref 22–32)
Calcium: 9.5 mg/dL (ref 8.9–10.3)
Chloride: 104 mmol/L (ref 101–111)
Creatinine, Ser: 2.13 mg/dL — ABNORMAL HIGH (ref 0.61–1.24)
GFR calc Af Amer: 31 mL/min — ABNORMAL LOW (ref >60)
GFR calc non Af Amer: 26 mL/min — ABNORMAL LOW (ref >60)
Glucose, Bld: 89 mg/dL (ref 65–99)
Potassium: 4.4 mmol/L (ref 3.5–5.1)
Sodium: 135 mmol/L (ref 135–145)

## 2017-01-30 LAB — CBC
HCT: 31 % — ABNORMAL LOW (ref 39.0–52.0)
Hemoglobin: 10.3 g/dL — ABNORMAL LOW (ref 13.0–17.0)
MCH: 33.1 pg (ref 26.0–34.0)
MCHC: 33.2 g/dL (ref 30.0–36.0)
MCV: 99.7 fL (ref 78.0–100.0)
Platelets: 210 10*3/uL (ref 150–400)
RBC: 3.11 MIL/uL — ABNORMAL LOW (ref 4.22–5.81)
RDW: 13.9 % (ref 11.5–15.5)
WBC: 6.5 10*3/uL (ref 4.0–10.5)

## 2017-01-30 LAB — PROTIME-INR
INR: 2.15
Prothrombin Time: 23.8 seconds — ABNORMAL HIGH (ref 11.4–15.2)

## 2017-01-30 MED ORDER — SODIUM CHLORIDE 0.9 % IV SOLN
INTRAVENOUS | Status: DC
  Administered 2017-01-30: 11:00:00 via INTRAVENOUS

## 2017-01-30 NOTE — Discharge Instructions (Signed)
Keep your appointment with your doctor for next Tuesday.  Return here for any problems

## 2017-01-30 NOTE — ED Provider Notes (Signed)
[facility] DEPT Provider Note   CSN: [id number] Arrival date & time: 01/30/17  1950     History   Chief Complaint Chief Complaint  Patient presents with  . Fall  . Dizziness    HPI Glenn Hicks is a 84 y.o. male.  84 year old male presents with several days of dizziness which is been intermittent and worse with certain positions.  Denies any tinnitus or hearing loss.  Has had mild headache without vomiting.  Has had falls as a result of the dizziness but denies any associated chest pain, shortness of breath, abdominal discomfort.  No recent changes to his medications.  No fever, vomiting, diarrhea.  Symptoms better with remaining still.  No treatment used for this complaint prior to arrival.  Patient does take Coumadin for history of DVT      Past Medical History:  Diagnosis Date  . Aortic atherosclerosis ([place])   . Arthritis   . Chronic kidney disease   . DVT (deep venous thrombosis) ([facility])    hx  . Gout   . Hypertension   . Low back pain   . Memory deficit   . PE (pulmonary embolism)    hx  . Shortness of breath   . Vitamin D deficiency   . Wears glasses     Patient Active Problem List   Diagnosis Date Noted  . Syncope 12/15/2016  . CKD (chronic kidney disease) 12/15/2016    Past Surgical History:  Procedure Laterality Date  . BACK SURGERY  1990   lumb lam  . CHOLECYSTECTOMY  2008   lap choli with umb HR  . COLONOSCOPY    . REPAIR EXTENSOR TENDON Right 05/26/2012   Procedure: RECONSTRUCTION EXTENSOR HOOD RIGHT MIDDLE FINGER;  Surgeon: Wynonia Sours, MD;  Location: [facility];  Service: Orthopedics;  Laterality: Right;  . TENDON REPAIR  1990   left arm  . TONSILLECTOMY    . UMBILICAL HERNIA REPAIR  2008  . UPPER GI ENDOSCOPY    . VOCAL CORD LATERALIZATION, ENDOSCOPIC APPROACH W/ MLB     polyp       Home Medications    Prior to Admission medications   Medication Sig Start Date End Date Taking?  Authorizing Provider  allopurinol (ZYLOPRIM) 100 MG tablet Take 100 mg by mouth daily.   Yes [provider]  b complex vitamins capsule Take 1 capsule by mouth daily.   Yes [provider]  Cholecalciferol (VITAMIN D3 PO) Take 2,000 Units by mouth daily.   Yes [provider]  colchicine 0.6 MG tablet Take 0.6 mg by mouth as needed.   Yes [provider]  finasteride (PROSCAR) 5 MG tablet Take 5 mg by mouth daily. 12/30/16  Yes [provider]  fluorouracil (EFUDEX) 5 % cream Apply 1 application topically daily. 01/22/17  Yes [provider]  furosemide (LASIX) 20 MG tablet Take 20 mg by mouth daily.   Yes [provider]  losartan (COZAAR) 25 MG tablet Take 25 mg by mouth daily.   Yes [provider]  tamsulosin (FLOMAX) 0.4 MG CAPS capsule Take 1 capsule by mouth daily. 10/03/16  Yes [provider]  triamcinolone lotion (KENALOG) 0.1 % Apply topically 2 (two) times daily as needed.    Yes [provider]  warfarin (COUMADIN) 3 MG tablet Take 3 mg by mouth daily. Takes 1/2 Monday and wednesday and friday   Yes [provider]    Family History Family  History  Problem Relation Age of Onset  . Lung cancer Mother   . Stroke Father   . Lung cancer Sister     Social History Social History   Tobacco Use  . Smoking status: Never Smoker  . Smokeless tobacco: Never Used  Substance Use Topics  . Alcohol use: No  . Drug use: No     Allergies   Galantamine and Memantine   Review of Systems Review of Systems  All other systems reviewed and are negative.    Physical Exam Updated Vital Signs BP 137/75   Pulse 82   Temp 98.9 F (37.2 C) (Oral)   Resp 15   Ht 1.753 m (5\' 9" )   Wt 80.7 kg (178 lb)   SpO2 99%   BMI 26.29 kg/m   Physical Exam  Constitutional: He is oriented to person, place, and time. He appears well-developed and well-nourished.  Non-toxic appearance. No distress.   HENT:  Head: Normocephalic and atraumatic.  Eyes: Conjunctivae, EOM and lids are normal. Pupils are equal, round, and reactive to light.  Neck: Normal range of motion. Neck supple. No tracheal deviation present. No thyroid mass present.  Cardiovascular: Normal rate, regular rhythm and normal heart sounds. Exam reveals no gallop.  No murmur heard. Pulmonary/Chest: Effort normal and breath sounds normal. No stridor. No respiratory distress. He has no decreased breath sounds. He has no wheezes. He has no rhonchi. He has no rales.  Abdominal: Soft. Normal appearance and bowel sounds are normal. He exhibits no distension. There is no tenderness. There is no rebound and no CVA tenderness.  Musculoskeletal: Normal range of motion. He exhibits no edema or tenderness.  Neurological: He is alert and oriented to person, place, and time. He has normal strength. No cranial nerve deficit or sensory deficit. GCS eye subscore is 4. GCS verbal subscore is 5. GCS motor subscore is 6.  Skin: Skin is warm and dry. No abrasion and no rash noted.  Psychiatric: He has a normal mood and affect. His speech is normal and behavior is normal.  Nursing note and vitals reviewed.    ED Treatments / Results  Labs (all labs ordered are listed, but only abnormal results are displayed) Labs Reviewed  BASIC METABOLIC PANEL - Abnormal; Notable for the following components:      Result Value   BUN 41 (*)    Creatinine, Ser 2.13 (*)    GFR calc non Af Amer 26 (*)    GFR calc Af Amer 31 (*)    All other components within normal limits  CBC - Abnormal; Notable for the following components:   RBC 3.11 (*)    Hemoglobin 10.3 (*)    HCT 31.0 (*)    All other components within normal limits  URINALYSIS, ROUTINE W REFLEX MICROSCOPIC  PROTIME-INR    EKG  EKG Interpretation  Date/Time:  Thursday January 30 2017 09:16:29 EST Ventricular Rate:  81 PR Interval:    QRS Duration: 87 QT Interval:  371 QTC  Calculation: 431 R Axis:   -22 Text Interpretation:  Sinus rhythm Borderline prolonged PR interval Borderline left axis deviation Low voltage, extremity leads Abnormal R-wave progression, late transition No significant change since last tracing Confirmed by Lacretia Leigh ([id number]) on 01/30/2017 10:44:48 AM       Radiology No results found.  Procedures Procedures (including critical care time)  Medications Ordered in ED Medications  0.9 %  sodium chloride infusion (not administered)     Initial Impression /  Assessment and Plan / ED Course  I have reviewed the triage vital signs and the nursing notes.  Pertinent labs & imaging results that were available during my care of the patient were reviewed by me and considered in my medical decision making (see chart for details).     Patient not orthostatic here.  Does not have any vertiginous symptoms.  States that symptoms are worse at night and possibly related to his medications.  He is able to ambulate here.  Family feels current will take him home and he will see his doctor on Tuesday  Final Clinical Impressions(s) / ED Diagnoses   Final diagnoses:  None    ED Discharge Orders    None       Lacretia Leigh, MD 01/30/17 1314

## 2017-01-30 NOTE — ED Triage Notes (Addendum)
Pt reports falling x2 days ago in his bathroom, pt now c/o left head radiating to neck pain. Pt reports worsening dizziness which he attributes this recent fall to. Pt reports taking daily anticoags. Pt A+OX4, ambulatory w/ stand-by assistance. No obvious injury or deformity observed.

## 2017-01-30 NOTE — ED Notes (Signed)
Pt not able to provide sample at this time. Urinal placed at bedside

## 2017-01-30 NOTE — ED Notes (Signed)
Bed: WA03 Expected date:  Expected time:  Means of arrival:  Comments: 

## 2017-08-14 ENCOUNTER — Ambulatory Visit: Payer: Medicare Other | Admitting: Podiatry

## 2017-08-14 ENCOUNTER — Encounter: Payer: Self-pay | Admitting: Podiatry

## 2017-08-14 VITALS — BP 165/75 | HR 79 | Ht 69.0 in | Wt 175.0 lb

## 2017-08-14 DIAGNOSIS — L600 Ingrowing nail: Secondary | ICD-10-CM | POA: Diagnosis not present

## 2017-08-14 DIAGNOSIS — M79671 Pain in right foot: Secondary | ICD-10-CM

## 2017-08-14 DIAGNOSIS — B351 Tinea unguium: Secondary | ICD-10-CM

## 2017-08-14 DIAGNOSIS — M79672 Pain in left foot: Secondary | ICD-10-CM

## 2017-08-14 NOTE — Progress Notes (Signed)
SUBJECTIVE: 84 y.o. year old male presents with his daughter in law for painful nails. She stated that he tried cut his own nail and got them bleeding.  He was diagnosed with Dementia and currently on blood thinner.   Review of Systems  Constitutional: Negative.   HENT: Negative.   Eyes: Negative.   Respiratory: Negative.   Cardiovascular: Negative.   Gastrointestinal: Negative.   Genitourinary: Negative.   Musculoskeletal: Negative.  Negative for joint pain.  Skin: Negative.      OBJECTIVE: DERMATOLOGIC EXAMINATION: Thick dystrophic yellow nails. Curved ingrown nails on 2nd and 3rd both feet. Irregular nail growth on both great toe from old matrixectomy.  VASCULAR EXAMINATION OF LOWER LIMBS: All pedal pulses are palpable with normal pulsation.  Capillary Filling times within 3 seconds in all digits.  No edema or erythema noted. Temperature gradient from tibial crest to dorsum of foot is within normal bilateral.  NEUROLOGIC EXAMINATION OF THE LOWER LIMBS: All epicritic and tactile sensations grossly intact. Vibratory sensations(128Hz  turning fork) intact at medial and lateral forefoot bilateral.  Sharp and Dull discriminatory sensations at the plantar ball of hallux is intact bilateral.   MUSCULOSKELETAL EXAMINATION: No gross deformities noted.  ASSESSMENT: Onycomycosis x 10. Ingrown nail 2nd and 3rd bilateral. Pain in both feet.  PLAN: Reviewed findings and available treatment options. All nails debrided. Bleeding nails cauterized with Silver Nitrate stick. Return in 3 months.

## 2017-08-14 NOTE — Patient Instructions (Signed)
Seen for hypertrophic nails. All nails debrided. Return in 3 months or as needed.  

## 2017-11-24 ENCOUNTER — Ambulatory Visit: Payer: Medicare Other | Admitting: Podiatry

## 2018-07-02 ENCOUNTER — Other Ambulatory Visit: Payer: Self-pay | Admitting: Family Medicine

## 2018-07-02 DIAGNOSIS — R634 Abnormal weight loss: Secondary | ICD-10-CM

## 2018-07-02 DIAGNOSIS — R1084 Generalized abdominal pain: Secondary | ICD-10-CM

## 2018-07-14 ENCOUNTER — Ambulatory Visit
Admission: RE | Admit: 2018-07-14 | Discharge: 2018-07-14 | Disposition: A | Discharge status: Home / Self Care | Payer: Medicare Other | Source: Ambulatory Visit | Attending: Family Medicine | Admitting: Family Medicine

## 2018-07-14 DIAGNOSIS — R634 Abnormal weight loss: Secondary | ICD-10-CM

## 2018-07-14 DIAGNOSIS — R1084 Generalized abdominal pain: Secondary | ICD-10-CM

## 2019-01-30 ENCOUNTER — Ambulatory Visit: Payer: Medicare Other | Attending: Internal Medicine

## 2019-01-30 DIAGNOSIS — Z23 Encounter for immunization: Secondary | ICD-10-CM | POA: Insufficient documentation

## 2019-01-30 NOTE — Progress Notes (Signed)
   Covid-19 Vaccination Clinic  Name:  Glenn Hicks    MRN: [id number] DOB: [birth year]  01/30/2019  [name] was observed post Covid-19 immunization for 30 minutes based on pre-vaccination screening without incidence. He was provided with Vaccine Information Sheet and instruction to access the V-Safe system.   [name] was instructed to call 911 with any severe reactions post vaccine: Marland Kitchen Difficulty breathing  . Swelling of your face and throat  . A fast heartbeat  . A bad rash all over your body  . Dizziness and weakness    Immunizations Administered    Name Date Dose VIS Date Route   Pfizer COVID-19 Vaccine 01/30/2019 11:29 AM 0.3 mL 12/25/2018 Intramuscular   Manufacturer: [place]   Lot: [id number]   [place]: [id number]

## 2019-02-16 ENCOUNTER — Emergency Department (HOSPITAL_COMMUNITY): Payer: Medicare Other

## 2019-02-16 ENCOUNTER — Encounter (HOSPITAL_COMMUNITY): Payer: Self-pay | Admitting: *Deleted

## 2019-02-16 ENCOUNTER — Emergency Department (HOSPITAL_COMMUNITY)
Admission: EM | Admit: 2019-02-16 | Discharge: 2019-02-16 | Disposition: A | Discharge status: Home / Self Care | Payer: Medicare Other | Attending: Emergency Medicine | Admitting: Emergency Medicine

## 2019-02-16 ENCOUNTER — Other Ambulatory Visit: Payer: Self-pay

## 2019-02-16 DIAGNOSIS — Y9389 Activity, other specified: Secondary | ICD-10-CM | POA: Diagnosis not present

## 2019-02-16 DIAGNOSIS — Z20822 Contact with and (suspected) exposure to covid-19: Secondary | ICD-10-CM | POA: Diagnosis not present

## 2019-02-16 DIAGNOSIS — W19XXXA Unspecified fall, initial encounter: Secondary | ICD-10-CM

## 2019-02-16 DIAGNOSIS — W01190A Fall on same level from slipping, tripping and stumbling with subsequent striking against furniture, initial encounter: Secondary | ICD-10-CM | POA: Insufficient documentation

## 2019-02-16 DIAGNOSIS — I129 Hypertensive chronic kidney disease with stage 1 through stage 4 chronic kidney disease, or unspecified chronic kidney disease: Secondary | ICD-10-CM | POA: Diagnosis not present

## 2019-02-16 DIAGNOSIS — R7989 Other specified abnormal findings of blood chemistry: Secondary | ICD-10-CM | POA: Insufficient documentation

## 2019-02-16 DIAGNOSIS — N189 Chronic kidney disease, unspecified: Secondary | ICD-10-CM | POA: Diagnosis not present

## 2019-02-16 DIAGNOSIS — Z7901 Long term (current) use of anticoagulants: Secondary | ICD-10-CM | POA: Insufficient documentation

## 2019-02-16 DIAGNOSIS — Y999 Unspecified external cause status: Secondary | ICD-10-CM | POA: Diagnosis not present

## 2019-02-16 DIAGNOSIS — S0990XA Unspecified injury of head, initial encounter: Secondary | ICD-10-CM | POA: Diagnosis present

## 2019-02-16 DIAGNOSIS — Y92003 Bedroom of unspecified non-institutional (private) residence as the place of occurrence of the external cause: Secondary | ICD-10-CM | POA: Insufficient documentation

## 2019-02-16 DIAGNOSIS — F0390 Unspecified dementia without behavioral disturbance: Secondary | ICD-10-CM | POA: Diagnosis not present

## 2019-02-16 LAB — CBC
HCT: 31.3 % — ABNORMAL LOW (ref 39.0–52.0)
Hemoglobin: 10.2 g/dL — ABNORMAL LOW (ref 13.0–17.0)
MCH: 33.2 pg (ref 26.0–34.0)
MCHC: 32.6 g/dL (ref 30.0–36.0)
MCV: 102 fL — ABNORMAL HIGH (ref 80.0–100.0)
Platelets: 194 10*3/uL (ref 150–400)
RBC: 3.07 MIL/uL — ABNORMAL LOW (ref 4.22–5.81)
RDW: 13.5 % (ref 11.5–15.5)
WBC: 6.4 10*3/uL (ref 4.0–10.5)
nRBC: 0 % (ref 0.0–0.2)

## 2019-02-16 LAB — BASIC METABOLIC PANEL
Anion gap: 9 (ref 5–15)
BUN: 48 mg/dL — ABNORMAL HIGH (ref 8–23)
CO2: 23 mmol/L (ref 22–32)
Calcium: 8.8 mg/dL — ABNORMAL LOW (ref 8.9–10.3)
Chloride: 102 mmol/L (ref 98–111)
Creatinine, Ser: 2.85 mg/dL — ABNORMAL HIGH (ref 0.61–1.24)
GFR calc Af Amer: 22 mL/min — ABNORMAL LOW (ref >60)
GFR calc non Af Amer: 19 mL/min — ABNORMAL LOW (ref >60)
Glucose, Bld: 114 mg/dL — ABNORMAL HIGH (ref 70–99)
Potassium: 3.9 mmol/L (ref 3.5–5.1)
Sodium: 134 mmol/L — ABNORMAL LOW (ref 135–145)

## 2019-02-16 LAB — RESPIRATORY PANEL BY RT PCR (FLU A&B, COVID)
Influenza A by PCR: NEGATIVE
Influenza B by PCR: NEGATIVE
SARS Coronavirus 2 by RT PCR: NEGATIVE

## 2019-02-16 LAB — URINALYSIS, ROUTINE W REFLEX MICROSCOPIC
Bilirubin Urine: NEGATIVE
Glucose, UA: NEGATIVE mg/dL
Hgb urine dipstick: NEGATIVE
Ketones, ur: NEGATIVE mg/dL
Leukocytes,Ua: NEGATIVE
Nitrite: NEGATIVE
Protein, ur: 30 mg/dL — AB
Specific Gravity, Urine: 1.009 (ref 1.005–1.030)
pH: 6 (ref 5.0–8.0)

## 2019-02-16 LAB — PROTIME-INR
INR: 2.8 — ABNORMAL HIGH (ref 0.8–1.2)
Prothrombin Time: 29.5 seconds — ABNORMAL HIGH (ref 11.4–15.2)

## 2019-02-16 NOTE — ED Notes (Signed)
Urinal placed at bedside. Patient and son made aware we need UA.

## 2019-02-16 NOTE — Discharge Instructions (Addendum)
You were seen in the emergency department today after fall.  Your CT scan and lab work was reviewed showing no acute findings.  You do have some chronic kidney disease which does appear slightly worse than labs from 2019.  Please have your primary care doctor repeat this blood work in the next 1 to 2 weeks to make sure it is not suddenly increasing but this is likely progression of the chronic kidney disease.  Your coronavirus test today was negative.  This should help with getting placement.  Please contact your PCP and to the nursing facility to discuss placement there as an outpatient.  Return to the emergency department with any new or suddenly worsening symptoms.

## 2019-02-16 NOTE — ED Notes (Signed)
Long, EDP at bedside.  Son at bedside.    Patient has hx: dementia   Patient walks with a walker at home per son

## 2019-02-16 NOTE — ED Provider Notes (Signed)
Emergency Department Provider Note   I have reviewed the triage vital signs and the nursing notes.   HISTORY  Chief Complaint Fall   HPI Glenn Hicks is a 86 y.o. male with past medical history reviewed below, on anticoagulation with h/o PE, presents to the emergency department for evaluation after fall.  He is here with his son.  Patient with dementia, level 5 caveat applies.  Patient lives at home with his wife who is also struggling from health issues.  The son states that over the past 2 weeks he has had multiple falls and required assistance getting back to his feet.  The family lives approximately 16 miles away but is in regular contact with the patient and his wife.  Today, he was apparently helping his wife make the bed when he lost his balance and fell striking the back of his head on the dresser.  He was unable to stand and ultimately required assistance getting to his feet.  Patient arrives by private vehicle.  The son states that this has been progressively worsening especially over the last 30 days but has had now 5 falls in the last 2 weeks.  They have been in contact with Glenn Hicks and were having a family meeting today about placing both the patient and his wife in a nursing facility.  The facility is ready to do their intake evaluation and reportedly have a bed ready.  States that he is most concerned about ruling out acute issues if negative would be comfortable having the patient return home with assistance until he can be placed as an outpatient which is already in progress.    Past Medical History:  Diagnosis Date  . Aortic atherosclerosis ([place])   . Arthritis   . Chronic kidney disease   . DVT (deep venous thrombosis) ([facility])    hx  . Gout   . Hypertension   . Low back pain   . Memory deficit   . PE (pulmonary embolism)    hx  . Shortness of breath   . Vitamin D deficiency   . Wears glasses     Patient Active Problem List   Diagnosis Date Noted  .  Syncope 12/15/2016  . CKD (chronic kidney disease) 12/15/2016    Past Surgical History:  Procedure Laterality Date  . BACK SURGERY  1990   lumb lam  . CHOLECYSTECTOMY  2008   lap choli with umb HR  . COLONOSCOPY    . REPAIR EXTENSOR TENDON Right 05/26/2012   Procedure: RECONSTRUCTION EXTENSOR HOOD RIGHT MIDDLE FINGER;  Surgeon: Wynonia Sours, MD;  Location: [facility];  Service: Orthopedics;  Laterality: Right;  . TENDON REPAIR  1990   left arm  . TONSILLECTOMY    . UMBILICAL HERNIA REPAIR  2008  . UPPER GI ENDOSCOPY    . VOCAL CORD LATERALIZATION, ENDOSCOPIC APPROACH W/ MLB     polyp    Allergies Galantamine and Memantine  Family History  Problem Relation Age of Onset  . Lung cancer Mother   . Stroke Father   . Lung cancer Sister     Social History Social History   Tobacco Use  . Smoking status: Never Smoker  . Smokeless tobacco: Never Used  Substance Use Topics  . Alcohol use: No  . Drug use: No    Review of Systems  Constitutional: No fever/chills Eyes: No visual changes. ENT: No sore throat. Cardiovascular: Denies chest pain. Respiratory: Denies shortness of breath. Gastrointestinal:  No abdominal pain.  No nausea, no vomiting.  No diarrhea.  No constipation. Genitourinary: Negative for dysuria. Musculoskeletal: Negative for back pain.  Skin: Negative for rash. Neurological: Negative for headaches, focal weakness or numbness.  10-point ROS otherwise negative.  ____________________________________________   PHYSICAL EXAM:  VITAL SIGNS: ED Triage Vitals  Enc Vitals Group     BP 02/16/19 1047 (!) 143/71     Pulse Rate 02/16/19 1047 93     Resp 02/16/19 1047 16     Temp 02/16/19 1047 98.2 F (36.8 C)     Temp Source 02/16/19 1047 Oral     SpO2 02/16/19 1047 100 %     Weight 02/16/19 1101 167 lb (75.8 kg)     Height 02/16/19 1101 5\' 9"  (1.753 m)   Constitutional: Alert and talkative but confused. Well appearing and in no acute  distress. Eyes: Conjunctivae are normal. PERRL.  Head: Atraumatic. Nose: No congestion/rhinnorhea. Mouth/Throat: Mucous membranes are moist.  Oropharynx non-erythematous. Neck: No stridor. No cervical spine tenderness to palpation. Cardiovascular: Normal rate, regular rhythm. Good peripheral circulation. Grossly normal heart sounds.   Respiratory: Normal respiratory effort.  No retractions. Lungs CTAB. Gastrointestinal: Soft and nontender. No distention.  Musculoskeletal: No lower extremity tenderness nor edema. No gross deformities of extremities. Normal passive ROM of the bilateral upper and lower extremities without point tenderness or deformity.  Neurologic:  Normal speech and language. No gross focal neurologic deficits are appreciated.  Skin:  Skin is warm, dry and intact. No rash noted. Old appearing bruising to the bilateral forearms.    ____________________________________________   LABS (all labs ordered are listed, but only abnormal results are displayed)  Labs Reviewed  BASIC METABOLIC PANEL - Abnormal; Notable for the following components:      Result Value   Sodium 134 (*)    Glucose, Bld 114 (*)    BUN 48 (*)    Creatinine, Ser 2.85 (*)    Calcium 8.8 (*)    GFR calc non Af Amer 19 (*)    GFR calc Af Amer 22 (*)    All other components within normal limits  CBC - Abnormal; Notable for the following components:   RBC 3.07 (*)    Hemoglobin 10.2 (*)    HCT 31.3 (*)    MCV 102.0 (*)    All other components within normal limits  URINALYSIS, ROUTINE W REFLEX MICROSCOPIC - Abnormal; Notable for the following components:   Protein, ur 30 (*)    Bacteria, UA RARE (*)    All other components within normal limits  PROTIME-INR - Abnormal; Notable for the following components:   Prothrombin Time 29.5 (*)    INR 2.8 (*)    All other components within normal limits  RESPIRATORY PANEL BY RT PCR (FLU A&B, COVID)    ____________________________________________  RADIOLOGY  CT Head Wo Contrast  Result Date: 02/16/2019 CLINICAL DATA:  [place] at home today striking RIGHT back side of head on a dresser before falling onto hardwood floor; witnessed fall, no loss of consciousness EXAM: CT HEAD WITHOUT CONTRAST TECHNIQUE: Contiguous axial images were obtained from the base of the skull through the vertex without intravenous contrast. Sagittal and coronal MPR images reconstructed from axial data set. COMPARISON:  01/30/2017 FINDINGS: Brain: Generalized atrophy. Cavum septum pellucidum. Otherwise normal ventricular morphology. No midline shift or mass effect. Mild small vessel chronic ischemic changes of deep cerebral white matter. No intracranial hemorrhage, mass lesion, or evidence of acute infarction. No extra-axial fluid  collections Vascular: Atherosclerotic calcifications of internal carotid arteries bilaterally at skull base. Dilated distal basilar artery 8 mm diameter unchanged. Skull: Intact Sinuses/Orbits: Clear Other: N/A IMPRESSION: Atrophy with small vessel chronic ischemic changes of deep cerebral white matter. No acute intracranial abnormalities. 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 Electronically Signed   By: Lavonia Dana M.D.   On: 02/16/2019 13:11   CT Cervical Spine Wo Contrast  Result Date: 02/16/2019 CLINICAL DATA:  Multiple falls, head trauma EXAM: CT CERVICAL SPINE WITHOUT CONTRAST TECHNIQUE: Multidetector CT imaging of the cervical spine was performed without intravenous contrast. Multiplanar CT image reconstructions were also generated. COMPARISON:  12/04/2016 FINDINGS: Alignment: Reversal of the cervical lordosis. Facet joints are aligned. No traumatic listhesis. Dens and lateral masses are aligned. Skull base and vertebrae: No acute fracture. No primary bone lesion or focal pathologic process. Soft tissues and spinal canal: No prevertebral fluid or swelling. No  visible canal hematoma. Disc levels: Multilevel intervertebral disc height loss and degenerative endplate spurring most pronounced at C3-4 through C6-7. Multilevel facet and uncovertebral arthropathy with bony foraminal narrowing most severe on the left at C4-5. No evidence of significant progression compared to previous study. Upper chest: Visualized lung apices clear. Other: Bilateral carotid bulb atherosclerotic calcification. IMPRESSION: 1. No acute fracture or traumatic listhesis. 2. Multilevel cervical spondylosis, not significantly progressed from prior study. Electronically Signed   By: Davina Poke D.O.   On: 02/16/2019 13:02    ____________________________________________   PROCEDURES  Procedure(s) performed:   Procedures  None  ____________________________________________   INITIAL IMPRESSION / ASSESSMENT AND PLAN / ED COURSE  Pertinent labs & imaging results that were available during my care of the patient were reviewed by me and considered in my medical decision making (see chart for details).   Patient presents to the emergency department for evaluation after fall at home.  Family is working on nursing facility placement and are in contact with [facility].  The son feels safe with the patient returning home temporarily pending placement.  No outward sign of trauma on my exam.  Patient with intact neurologic exam.  Plan for screening labs, Covid testing pending placement, CT head and C-spine.   Patient's lab work reviewed.  His creatinine is drifting upwards to 2.85 which is slightly higher than his values from 2 years ago.  His BUN is roughly the same with no electrolyte disturbance.  I have very low suspicion that this represents an acute kidney injury and suspect more progression of chronic kidney disease.  I have discussed this with the patient's son who will follow with the primary care doctor in the next 1 to 2 weeks for repeat lab work and referral to nephrology if  needed.  CT imaging of the head reviewed with no acute findings.  Covid negative.  Son has plan to move forward with placement of his father in a nursing facility as an outpatient.  He is contacted at facility already and has an intake appointment scheduled. He feels comfortable with discharge at this time.  ____________________________________________  FINAL CLINICAL IMPRESSION(S) / ED DIAGNOSES  Final diagnoses:  Fall, initial encounter  Injury of head, initial encounter  Elevated serum creatinine    Note:  This document was prepared using Dragon voice recognition software and may include unintentional dictation errors.  Nanda Quinton, MD, [facility] Emergency Medicine    Francee Setzer, [facility], MD 02/17/19 [phone]

## 2019-02-16 NOTE — ED Triage Notes (Signed)
Patient fell at home today, striking the back right side of head on a dresser before falling on hardwood floor.  Fall was witnessed by spouse and denies LOC.  Patient has slight swelling on head and small amount of swelling and bruising on right wrist.  Patient's son reports frequent falls in the last 2 weeks.

## 2019-02-20 ENCOUNTER — Ambulatory Visit: Payer: Medicare Other | Attending: Internal Medicine

## 2019-02-20 DIAGNOSIS — Z23 Encounter for immunization: Secondary | ICD-10-CM

## 2019-02-20 NOTE — Progress Notes (Signed)
   Covid-19 Vaccination Clinic  Name:  Glenn Hicks    MRN: [id number] DOB: March 07, 1928  02/20/2019  [name] was observed post Covid-19 immunization for 15 minutes without incidence. He was provided with Vaccine Information Sheet and instruction to access the V-Safe system.   [name] was instructed to call 911 with any severe reactions post vaccine: Marland Kitchen Difficulty breathing  . Swelling of your face and throat  . A fast heartbeat  . A bad rash all over your body  . Dizziness and weakness    Immunizations Administered    Name Date Dose VIS Date Route   Pfizer COVID-19 Vaccine 02/20/2019 11:27 AM 0.3 mL 12/25/2018 Intramuscular   Manufacturer: [place]   Lot: [id number]   [place]: [id number]

## 2019-02-22 ENCOUNTER — Emergency Department (HOSPITAL_COMMUNITY): Payer: Medicare Other

## 2019-02-22 ENCOUNTER — Encounter (HOSPITAL_COMMUNITY): Payer: Self-pay

## 2019-02-22 ENCOUNTER — Other Ambulatory Visit: Payer: Self-pay

## 2019-02-22 ENCOUNTER — Emergency Department (HOSPITAL_COMMUNITY)
Admission: EM | Admit: 2019-02-22 | Discharge: 2019-02-22 | Disposition: A | Discharge status: Home / Self Care | Payer: Medicare Other | Attending: Emergency Medicine | Admitting: Emergency Medicine

## 2019-02-22 DIAGNOSIS — Z7901 Long term (current) use of anticoagulants: Secondary | ICD-10-CM | POA: Insufficient documentation

## 2019-02-22 DIAGNOSIS — I129 Hypertensive chronic kidney disease with stage 1 through stage 4 chronic kidney disease, or unspecified chronic kidney disease: Secondary | ICD-10-CM | POA: Insufficient documentation

## 2019-02-22 DIAGNOSIS — N189 Chronic kidney disease, unspecified: Secondary | ICD-10-CM | POA: Diagnosis not present

## 2019-02-22 DIAGNOSIS — Y92002 Bathroom of unspecified non-institutional (private) residence single-family (private) house as the place of occurrence of the external cause: Secondary | ICD-10-CM | POA: Diagnosis not present

## 2019-02-22 DIAGNOSIS — F0390 Unspecified dementia without behavioral disturbance: Secondary | ICD-10-CM | POA: Diagnosis not present

## 2019-02-22 DIAGNOSIS — W01198A Fall on same level from slipping, tripping and stumbling with subsequent striking against other object, initial encounter: Secondary | ICD-10-CM | POA: Insufficient documentation

## 2019-02-22 DIAGNOSIS — Y999 Unspecified external cause status: Secondary | ICD-10-CM | POA: Insufficient documentation

## 2019-02-22 DIAGNOSIS — Y9389 Activity, other specified: Secondary | ICD-10-CM | POA: Insufficient documentation

## 2019-02-22 DIAGNOSIS — S0101XA Laceration without foreign body of scalp, initial encounter: Secondary | ICD-10-CM | POA: Diagnosis not present

## 2019-02-22 DIAGNOSIS — Z79899 Other long term (current) drug therapy: Secondary | ICD-10-CM | POA: Diagnosis not present

## 2019-02-22 DIAGNOSIS — Z23 Encounter for immunization: Secondary | ICD-10-CM | POA: Diagnosis not present

## 2019-02-22 DIAGNOSIS — S0990XA Unspecified injury of head, initial encounter: Secondary | ICD-10-CM

## 2019-02-22 LAB — CBC
HCT: 34 % — ABNORMAL LOW (ref 39.0–52.0)
Hemoglobin: 10.8 g/dL — ABNORMAL LOW (ref 13.0–17.0)
MCH: 32.7 pg (ref 26.0–34.0)
MCHC: 31.8 g/dL (ref 30.0–36.0)
MCV: 103 fL — ABNORMAL HIGH (ref 80.0–100.0)
Platelets: 173 10*3/uL (ref 150–400)
RBC: 3.3 MIL/uL — ABNORMAL LOW (ref 4.22–5.81)
RDW: 13.5 % (ref 11.5–15.5)
WBC: 6 10*3/uL (ref 4.0–10.5)
nRBC: 0 % (ref 0.0–0.2)

## 2019-02-22 LAB — I-STAT CHEM 8, ED
BUN: 54 mg/dL — ABNORMAL HIGH (ref 8–23)
Calcium, Ion: 1.06 mmol/L — ABNORMAL LOW (ref 1.15–1.40)
Chloride: 105 mmol/L (ref 98–111)
Creatinine, Ser: 2.4 mg/dL — ABNORMAL HIGH (ref 0.61–1.24)
Glucose, Bld: 82 mg/dL (ref 70–99)
HCT: 28 % — ABNORMAL LOW (ref 39.0–52.0)
Hemoglobin: 9.5 g/dL — ABNORMAL LOW (ref 13.0–17.0)
Potassium: 5.7 mmol/L — ABNORMAL HIGH (ref 3.5–5.1)
Sodium: 134 mmol/L — ABNORMAL LOW (ref 135–145)
TCO2: 25 mmol/L (ref 22–32)

## 2019-02-22 LAB — BASIC METABOLIC PANEL
Anion gap: 14 (ref 5–15)
BUN: 41 mg/dL — ABNORMAL HIGH (ref 8–23)
CO2: 19 mmol/L — ABNORMAL LOW (ref 22–32)
Calcium: 8.8 mg/dL — ABNORMAL LOW (ref 8.9–10.3)
Chloride: 97 mmol/L — ABNORMAL LOW (ref 98–111)
Creatinine, Ser: 2.65 mg/dL — ABNORMAL HIGH (ref 0.61–1.24)
GFR calc Af Amer: 24 mL/min — ABNORMAL LOW (ref >60)
GFR calc non Af Amer: 21 mL/min — ABNORMAL LOW (ref >60)
Glucose, Bld: 92 mg/dL (ref 70–99)
Potassium: 5.1 mmol/L (ref 3.5–5.1)
Sodium: 130 mmol/L — ABNORMAL LOW (ref 135–145)

## 2019-02-22 LAB — PROTIME-INR
INR: 3.2 — ABNORMAL HIGH (ref 0.8–1.2)
Prothrombin Time: 32.5 seconds — ABNORMAL HIGH (ref 11.4–15.2)

## 2019-02-22 LAB — CBG MONITORING, ED: Glucose-Capillary: 82 mg/dL (ref 70–99)

## 2019-02-22 MED ORDER — TETANUS-DIPHTH-ACELL PERTUSSIS 5-2.5-18.5 LF-MCG/0.5 IM SUSP
0.5000 mL | Freq: Once | INTRAMUSCULAR | Status: AC
  Administered 2019-02-22: 0.5 mL via INTRAMUSCULAR
  Filled 2019-02-22: qty 0.5

## 2019-02-22 MED ORDER — ACETAMINOPHEN 500 MG PO TABS
1000.0000 mg | ORAL_TABLET | Freq: Once | ORAL | Status: AC
  Administered 2019-02-22: 07:00:00 1000 mg via ORAL
  Filled 2019-02-22: qty 2

## 2019-02-22 MED ORDER — SODIUM CHLORIDE 0.9 % IV BOLUS (SEPSIS): 500.0000 mL | Freq: Once | INTRAVENOUS | Status: DC

## 2019-02-22 NOTE — ED Provider Notes (Addendum)
  Physical Exam  BP 140/84   Pulse 72   Temp 98.5 F (36.9 C) (Oral)   Resp 15   SpO2 96%   Physical Exam  ED Course/Procedures     Procedures  MDM  Received patient in signout.  Fall.  Imaging reassuring.  Initial creatinine elevated but was artificial due to hemolysis.  Recheck was normal.  Wound closed by previous provider.  However family is been attempting placement for him.  However it was set up on Friday but now did not want to go.  With frequent falls will have patient seen by transition of care to see if other help can be arranged.  Transitions of care is seen patient.  Patient will be visited by home health for further needs.  Face-to-face placed.       Davonna Belling, MD 02/22/19 [id number]    Davonna Belling, MD 02/22/19 [phone]

## 2019-02-22 NOTE — Discharge Planning (Signed)
Flois Mctague J. Clydene Laming, RN, BSN, [employer] [phone] Spoke with pt at bedside regarding discharge planning for [facility]. Offered pt list of home health agencies to choose from.  Pt chose [facility] to render services. Amy Hyatt of [facility] notified. Patient made aware that [facility] will be in contact in 24-48 hours.  No DME needs identified at this time.

## 2019-02-22 NOTE — ED Notes (Signed)
One staple applied to the laceration and gauze placed to control bleeding.

## 2019-02-22 NOTE — ED Notes (Signed)
Pt came to the ED via EMS. Pt conscious, breathing, and A&Ox3. Pt endorses "I was walking to the bathroom and I slipped in the bathroom because of my socks". Chest rise and fall equally with non-labored breathing. Lungs clear apex to base. Abd soft and non-tender. Pt denies chest pain, n/v/d, shortness of breath, and f/c. PIVC placed on the right hand with a 20G which had positive blood return and flushed without pain or infiltration by EMS. Blood collected, labeled, and sent to lab. Bed in lowest position with call light within reach. Pt on continuous blood pressure, pulse ox, and cardiac monitor. Will continue to monitor. Awaiting MD eval. No distress noted. Pt has a one inch lac to the superior aspect of his skull and skin abrasions on the anterior right forearm with bleeding controlled.

## 2019-02-22 NOTE — ED Notes (Signed)
Patient given discharge instructions patient verbalizes understanding. 

## 2019-02-22 NOTE — Discharge Instructions (Addendum)
Home health should be contacting you at home.

## 2019-02-22 NOTE — ED Notes (Signed)
Pt transported to xray via cart.

## 2019-02-22 NOTE — ED Provider Notes (Signed)
TIME SEEN: 6:25 AM  CHIEF COMPLAINT: Fall  HPI: Patient is an 86 year old male with history of dementia, hypertension, PE and DVT on Coumadin, chronic kidney disease who presents to the emergency department after he had a fall.  States he fell going to the bathroom.  States he has a walker at home but was not using it tonight.  Is complaining of headache.  No chest pain or shortness of breath.  No abdominal pain.  No neck or back pain.  No numbness or weakness.  Unsure of last tetanus vaccination.  It appears he was here on 02/16/2019 after multiple falls.  Labs unremarkable other than creatinine of 2.85.  Was 2.13 in January 2019.  Urine showed no sign of infection.  Covid swab was negative.  Per patient's son, they are working on placing patient into [facility] nursing facility.  Received his COVID-19 vaccination on 01/30/2019 and 02/20/2019.  ROS: Level 5 caveat due to dementia  PAST MEDICAL HISTORY/PAST SURGICAL HISTORY:  Past Medical History:  Diagnosis Date  . Aortic atherosclerosis ([place])   . Arthritis   . Chronic kidney disease   . DVT (deep venous thrombosis) ([facility])    hx  . Gout   . Hypertension   . Low back pain   . Memory deficit   . PE (pulmonary embolism)    hx  . Shortness of breath   . Vitamin D deficiency   . Wears glasses     MEDICATIONS:  Prior to Admission medications   Medication Sig Start Date End Date Taking? Authorizing Provider  allopurinol (ZYLOPRIM) 100 MG tablet Take 100 mg by mouth daily.    [provider]  b complex vitamins capsule Take 1 capsule by mouth daily.    [provider]  Cholecalciferol (VITAMIN D3 PO) Take 2,000 Units by mouth daily.    [provider]  colchicine 0.6 MG tablet Take 0.6 mg by mouth as needed.    [provider]  finasteride (PROSCAR) 5 MG tablet Take 5 mg by mouth daily. 12/30/16   [provider]  fluorouracil (EFUDEX) 5 % cream Apply 1 application topically daily. 01/22/17    [provider]  furosemide (LASIX) 20 MG tablet Take 20 mg by mouth daily.    [provider]  losartan (COZAAR) 25 MG tablet Take 25 mg by mouth daily.    [provider]  tamsulosin (FLOMAX) 0.4 MG CAPS capsule Take 1 capsule by mouth daily. 10/03/16   [provider]  triamcinolone lotion (KENALOG) 0.1 % Apply topically 2 (two) times daily as needed.     [provider]  warfarin (COUMADIN) 3 MG tablet Take 3 mg by mouth daily. Takes 1/2 Monday and wednesday and friday    [provider]    ALLERGIES:  Allergies  Allergen Reactions  . Galantamine     Other reaction(s): Dizziness (intolerance)  . Memantine     Other reaction(s): Other (See Comments) CONSTIPATION    SOCIAL HISTORY:  Social History   Tobacco Use  . Smoking status: Never Smoker  . Smokeless tobacco: Never Used  Substance Use Topics  . Alcohol use: No    FAMILY HISTORY: Family History  Problem Relation Age of Onset  . Lung cancer Mother   . Stroke Father   . Lung cancer Sister     EXAM: BP (!) 156/70   Pulse 85   Temp 98.5 F (36.9 C) (Oral)   Resp 18   SpO2 98%  CONSTITUTIONAL: Alert and oriented to person and place but not time and responds appropriately to questions.  Elderly.  Pleasant.  In no distress.  Extremely hard of hearing. HEAD: Normocephalic; 2 cm laceration to the top of the scalp that is actively bleeding with slow ooze and no sign of arterial bleed, patient has a 5 cm abrasion to the front of the scalp with associated hematoma EYES: Conjunctivae clear, PERRL, EOMI ENT: normal nose; no rhinorrhea; moist mucous membranes; pharynx without lesions noted; no dental injury; no septal hematoma NECK: Supple, no meningismus, no LAD; no midline spinal tenderness, step-off or deformity; trachea midline CARD: RRR; S1 and S2 appreciated; no murmurs, no clicks, no rubs, no gallops RESP: Normal chest excursion without splinting or tachypnea; breath  sounds clear and equal bilaterally; no wheezes, no rhonchi, no rales; no hypoxia or respiratory distress CHEST:  chest wall stable, no crepitus or ecchymosis or deformity, nontender to palpation; no flail chest ABD/GI: Normal bowel sounds; non-distended; soft, non-tender, no rebound, no guarding; no ecchymosis or other lesions noted PELVIS:  stable, nontender to palpation BACK:  The back appears normal and is non-tender to palpation, there is no CVA tenderness; no midline spinal tenderness, step-off or deformity EXT: Abrasion to the right anterior knee but no bony tenderness, bony deformity or joint effusion.  Patient has 3 skin tears noted to the right forearm with associated swelling and ecchymosis.  No appreciable bony tenderness.  Extremities are warm and well-perfused.  Compartments are soft.  Otherwise extremities are nontender to palpation. SKIN: Normal color for age and race; warm NEURO: Moves all extremities equally, normal speech, no facial asymmetry, reports normal sensation diffusely, hard of hearing PSYCH: The patient's mood and manner are appropriate. Grooming and personal hygiene are appropriate.  MEDICAL DECISION MAKING: Patient here after mechanical fall.  It appears he was here 6 days ago after multiple falls and had a medical work-up that was unrevealing.  Family is moving forward with placing patient at [facility].  Unsure of last tetanus vaccination and there is no Tdap listed in our system.  Will update today.  Does have multiple skin tears that we will clean and apply sterile, nonadherent dressings.  Will obtain x-ray of the right forearm.  Will obtain CT of the head and cervical spine.  Laceration to the top of the scalp has been repaired with 1 staple.  Will provide with Tylenol for discomfort.  Will repeat basic labs today and recheck INR.  EKG shows no ischemic abnormality.  CBG normal.  Patient's son at bedside is comfortable with this plan.  ED PROGRESS: Patient's labs  show potassium of 5.7 but this is an i-STAT Chem-8.  Could be slightly hemolyzed.  Creatinine 2.40.  Hemoglobin 9.5.  Was 10.2 on 02/16/2019.  No significant blood loss from his head or forearm injuries today.  Likely from his chronic kidney disease.  Otherwise labs unremarkable.  Will give gentle IV hydration and repeat potassium.  His EKG does not show any findings consistent with hyperkalemia.   7:00 AM  Signed out to Dr. Alvino Chapel.     I reviewed all nursing notes and pertinent previous records as available.  I have interpreted any EKGs, lab and urine results, imaging (as available).   EKG Interpretation  Date/Time:  Monday February 22 2019 06:01:11 EST Ventricular Rate:  89 PR Interval:    QRS Duration: 133 QT Interval:  384 QTC Calculation: 452 R Axis:   0 Text Interpretation: Atrial fibrillation versus NSR with  artifact Nonspecific intraventricular conduction delay Confirmed by Pryor Curia [phone]) on 02/22/2019 6:41:24 AM      LACERATION REPAIR Performed by: Cyril Mourning Latrell Reitan Authorized by: Cyril Mourning Jaelyne Deeg Consent: Verbal consent obtained. Risks and benefits: risks, benefits and alternatives were discussed Consent given by: patient Patient identity confirmed: provided demographic data Prepped and Draped in normal sterile fashion Wound explored  Laceration Location: Top of the scalp  Laceration Length: 2 cm  No Foreign Bodies seen or palpated  Anesthesia: None  Irrigation method: syringe Amount of cleaning: standard  Skin closure: With 1 staple  Technique: Area irrigated copiously and cleaned with ChloraPrep.  1 staple applied with good wound approximation.  No foreign body appreciated.  Pressure dressing applied.  Patient tolerance: Patient tolerated the procedure well with no immediate complications.    SHIHEEM KOLESZAR was evaluated in Emergency Department on 02/22/2019 for the symptoms described in the history of present illness. He was evaluated in the context of the  global COVID-19 pandemic, which necessitated consideration that the patient might be at risk for infection with the SARS-CoV-2 virus that causes COVID-19. Institutional protocols and algorithms that pertain to the evaluation of patients at risk for COVID-19 are in a state of rapid change based on information released by regulatory bodies including the CDC and federal and state organizations. These policies and algorithms were followed during the patient's care in the ED.  Patient was seen wearing N95, face shield, gloves.     Tacara Hadlock, Delice Bison, DO 02/22/19 [phone]

## 2019-02-22 NOTE — Social Work (Signed)
EDCSW received call from Pts daughter-in-law seeking information about [facility]. CSW gave number.

## 2019-07-11 ENCOUNTER — Encounter (HOSPITAL_COMMUNITY): Payer: Self-pay

## 2019-07-11 ENCOUNTER — Other Ambulatory Visit: Payer: Self-pay

## 2019-07-11 ENCOUNTER — Emergency Department (HOSPITAL_COMMUNITY): Payer: Medicare Other

## 2019-07-11 ENCOUNTER — Emergency Department (HOSPITAL_COMMUNITY)
Admission: EM | Admit: 2019-07-11 | Discharge: 2019-07-11 | Disposition: A | Discharge status: Home / Self Care | Payer: Medicare Other | Attending: Emergency Medicine | Admitting: Emergency Medicine

## 2019-07-11 DIAGNOSIS — S0181XA Laceration without foreign body of other part of head, initial encounter: Secondary | ICD-10-CM

## 2019-07-11 DIAGNOSIS — N189 Chronic kidney disease, unspecified: Secondary | ICD-10-CM | POA: Diagnosis not present

## 2019-07-11 DIAGNOSIS — I129 Hypertensive chronic kidney disease with stage 1 through stage 4 chronic kidney disease, or unspecified chronic kidney disease: Secondary | ICD-10-CM | POA: Diagnosis not present

## 2019-07-11 DIAGNOSIS — Z79899 Other long term (current) drug therapy: Secondary | ICD-10-CM | POA: Insufficient documentation

## 2019-07-11 DIAGNOSIS — S0990XA Unspecified injury of head, initial encounter: Secondary | ICD-10-CM

## 2019-07-11 DIAGNOSIS — R519 Headache, unspecified: Secondary | ICD-10-CM | POA: Diagnosis present

## 2019-07-11 DIAGNOSIS — Y92002 Bathroom of unspecified non-institutional (private) residence single-family (private) house as the place of occurrence of the external cause: Secondary | ICD-10-CM | POA: Diagnosis not present

## 2019-07-11 DIAGNOSIS — F0390 Unspecified dementia without behavioral disturbance: Secondary | ICD-10-CM | POA: Diagnosis not present

## 2019-07-11 DIAGNOSIS — W010XXA Fall on same level from slipping, tripping and stumbling without subsequent striking against object, initial encounter: Secondary | ICD-10-CM | POA: Diagnosis not present

## 2019-07-11 DIAGNOSIS — Y999 Unspecified external cause status: Secondary | ICD-10-CM | POA: Diagnosis not present

## 2019-07-11 DIAGNOSIS — W19XXXA Unspecified fall, initial encounter: Secondary | ICD-10-CM

## 2019-07-11 DIAGNOSIS — Y9389 Activity, other specified: Secondary | ICD-10-CM | POA: Diagnosis not present

## 2019-07-11 LAB — CBC WITH DIFFERENTIAL/PLATELET
Abs Immature Granulocytes: 0.04 10*3/uL (ref 0.00–0.07)
Basophils Absolute: 0 10*3/uL (ref 0.0–0.1)
Basophils Relative: 1 %
Eosinophils Absolute: 0.2 10*3/uL (ref 0.0–0.5)
Eosinophils Relative: 3 %
HCT: 34 % — ABNORMAL LOW (ref 39.0–52.0)
Hemoglobin: 10.8 g/dL — ABNORMAL LOW (ref 13.0–17.0)
Immature Granulocytes: 1 %
Lymphocytes Relative: 24 %
Lymphs Abs: 1.6 10*3/uL (ref 0.7–4.0)
MCH: 32.3 pg (ref 26.0–34.0)
MCHC: 31.8 g/dL (ref 30.0–36.0)
MCV: 101.8 fL — ABNORMAL HIGH (ref 80.0–100.0)
Monocytes Absolute: 0.6 10*3/uL (ref 0.1–1.0)
Monocytes Relative: 8 %
Neutro Abs: 4.4 10*3/uL (ref 1.7–7.7)
Neutrophils Relative %: 63 %
Platelets: 216 10*3/uL (ref 150–400)
RBC: 3.34 MIL/uL — ABNORMAL LOW (ref 4.22–5.81)
RDW: 14 % (ref 11.5–15.5)
WBC: 6.9 10*3/uL (ref 4.0–10.5)
nRBC: 0 % (ref 0.0–0.2)

## 2019-07-11 LAB — URINALYSIS, ROUTINE W REFLEX MICROSCOPIC
Bilirubin Urine: NEGATIVE
Glucose, UA: NEGATIVE mg/dL
Ketones, ur: NEGATIVE mg/dL
Leukocytes,Ua: NEGATIVE
Nitrite: NEGATIVE
Protein, ur: NEGATIVE mg/dL
Specific Gravity, Urine: 1.009 (ref 1.005–1.030)
pH: 6 (ref 5.0–8.0)

## 2019-07-11 LAB — COMPREHENSIVE METABOLIC PANEL
ALT: 24 U/L (ref 0–44)
AST: 26 U/L (ref 15–41)
Albumin: 3.4 g/dL — ABNORMAL LOW (ref 3.5–5.0)
Alkaline Phosphatase: 64 U/L (ref 38–126)
Anion gap: 10 (ref 5–15)
BUN: 38 mg/dL — ABNORMAL HIGH (ref 8–23)
CO2: 24 mmol/L (ref 22–32)
Calcium: 9.5 mg/dL (ref 8.9–10.3)
Chloride: 103 mmol/L (ref 98–111)
Creatinine, Ser: 2.27 mg/dL — ABNORMAL HIGH (ref 0.61–1.24)
GFR calc Af Amer: 29 mL/min — ABNORMAL LOW (ref >60)
GFR calc non Af Amer: 25 mL/min — ABNORMAL LOW (ref >60)
Glucose, Bld: 102 mg/dL — ABNORMAL HIGH (ref 70–99)
Potassium: 4.1 mmol/L (ref 3.5–5.1)
Sodium: 137 mmol/L (ref 135–145)
Total Bilirubin: 0.5 mg/dL (ref 0.3–1.2)
Total Protein: 6.8 g/dL (ref 6.5–8.1)

## 2019-07-11 LAB — I-STAT CHEM 8, ED
BUN: 37 mg/dL — ABNORMAL HIGH (ref 8–23)
Calcium, Ion: 1.26 mmol/L (ref 1.15–1.40)
Chloride: 102 mmol/L (ref 98–111)
Creatinine, Ser: 2.5 mg/dL — ABNORMAL HIGH (ref 0.61–1.24)
Glucose, Bld: 97 mg/dL (ref 70–99)
HCT: 32 % — ABNORMAL LOW (ref 39.0–52.0)
Hemoglobin: 10.9 g/dL — ABNORMAL LOW (ref 13.0–17.0)
Potassium: 4 mmol/L (ref 3.5–5.1)
Sodium: 139 mmol/L (ref 135–145)
TCO2: 25 mmol/L (ref 22–32)

## 2019-07-11 LAB — PROTIME-INR
INR: 2.8 — ABNORMAL HIGH (ref 0.8–1.2)
Prothrombin Time: 28.2 seconds — ABNORMAL HIGH (ref 11.4–15.2)

## 2019-07-11 MED ORDER — LACTATED RINGERS IV BOLUS
1000.0000 mL | Freq: Once | INTRAVENOUS | Status: AC
  Administered 2019-07-11: 1000 mL via INTRAVENOUS

## 2019-07-11 NOTE — ED Triage Notes (Signed)
Pt arrived via [facility]; pt w/ c/o fall in bathroom ; pt stated that he tripped and fell trying to use the restroom; denies LOC, denies, N/V, denies dizziness; pt with hx of dementia and at basline per family; pt has 3-4" lac to L forehead; skin tear to L elbow and R shin

## 2019-07-11 NOTE — ED Notes (Signed)
Patient given discharge instructions. Questions were answered. Patient verbalized understanding of discharge instructions and care at home.  

## 2019-07-11 NOTE — Discharge Instructions (Addendum)
Please leave dressing in place for 2-3 days.  Please leave the steri-strips and other device in place for 5-7 days or until the stickers start to curl then it can be gently removed. If it comes off before that it is ok.  Keep wound clean and dry aside from putting bacitracin on it twice a day after steri strips remove.

## 2019-07-13 NOTE — ED Provider Notes (Signed)
[facility] EMERGENCY DEPARTMENT Provider Note   CSN: [id number] Arrival date & time: 07/11/19  0446     History Chief Complaint  Patient presents with  . Fall    on Coumadin    Glenn Hicks is a 86 y.o. male.   Fall This is a new problem. The current episode started 1 to 2 hours ago. The problem occurs constantly. The problem has been resolved. Associated symptoms include headaches. Pertinent negatives include no chest pain and no abdominal pain. Nothing aggravates the symptoms. Nothing relieves the symptoms. He has tried nothing for the symptoms.       Past Medical History:  Diagnosis Date  . Aortic atherosclerosis ([place])   . Arthritis   . Chronic kidney disease   . DVT (deep venous thrombosis) ([facility])    hx  . Gout   . Hypertension   . Low back pain   . Memory deficit   . PE (pulmonary embolism)    hx  . Shortness of breath   . Vitamin D deficiency   . Wears glasses     Patient Active Problem List   Diagnosis Date Noted  . Syncope 12/15/2016  . CKD (chronic kidney disease) 12/15/2016    Past Surgical History:  Procedure Laterality Date  . BACK SURGERY  1990   lumb lam  . CHOLECYSTECTOMY  2008   lap choli with umb HR  . COLONOSCOPY    . REPAIR EXTENSOR TENDON Right 05/26/2012   Procedure: RECONSTRUCTION EXTENSOR HOOD RIGHT MIDDLE FINGER;  Surgeon: Wynonia Sours, MD;  Location: [facility];  Service: Orthopedics;  Laterality: Right;  . TENDON REPAIR  1990   left arm  . TONSILLECTOMY    . UMBILICAL HERNIA REPAIR  2008  . UPPER GI ENDOSCOPY    . VOCAL CORD LATERALIZATION, ENDOSCOPIC APPROACH W/ MLB     polyp       Family History  Problem Relation Age of Onset  . Lung cancer Mother   . Stroke Father   . Lung cancer Sister     Social History   Tobacco Use  . Smoking status: Never Smoker  . Smokeless tobacco: Never Used  Vaping Use  . Vaping Use: Never used  Substance Use Topics  . Alcohol use: No  . Drug  use: No    Home Medications Prior to Admission medications   Medication Sig Start Date End Date Taking? Authorizing Provider  allopurinol (ZYLOPRIM) 100 MG tablet Take 100 mg by mouth daily.   Yes [provider]  b complex vitamins capsule Take 1 capsule by mouth daily.   Yes [provider]  Cholecalciferol (VITAMIN D3 PO) Take 2,000 Units by mouth daily.   Yes [provider]  finasteride (PROSCAR) 5 MG tablet Take 5 mg by mouth daily. 12/30/16  Yes [provider]  furosemide (LASIX) 20 MG tablet Take 20 mg by mouth daily.   Yes [provider]  ketoconazole (NIZORAL) 2 % cream Apply 1 application topically 2 (two) times daily. 05/17/19  Yes [provider]  tamsulosin (FLOMAX) 0.4 MG CAPS capsule Take 1 capsule by mouth daily. 10/03/16  Yes [provider]  warfarin (COUMADIN) 3 MG tablet Take 1.5-3 mg by mouth See admin instructions. Takes 1/2 tab (1.5mg )  Monday and wednesday and Friday 3mg  Tues,Thurs,Sat,Sun   Yes [provider]    Allergies    Galantamine and Memantine  Review of Systems   Review of Systems  Cardiovascular: Negative for chest pain.  Gastrointestinal: Negative for abdominal pain.  Neurological: Positive for headaches.  All other systems reviewed and are negative.   Physical Exam Updated Vital Signs BP (!) 148/73 (BP Location: Right Arm)   Pulse 83   Temp 98.2 F (36.8 C) (Oral)   Resp 18   SpO2 98%   Physical Exam Vitals and nursing note reviewed.  Constitutional:      Appearance: He is well-developed.  HENT:     Head: Normocephalic and atraumatic.     Mouth/Throat:     Mouth: Mucous membranes are moist.     Pharynx: Oropharynx is clear.  Eyes:     Pupils: Pupils are equal, round, and reactive to light.  Cardiovascular:     Rate and Rhythm: Normal rate.  Pulmonary:     Effort: Pulmonary effort is normal. No respiratory distress.  Abdominal:     General: There is no  distension.  Musculoskeletal:        General: No swelling or tenderness. Normal range of motion.     Cervical back: Normal range of motion.  Skin:    General: Skin is warm and dry.     Comments: Laceration to right forehead approximately 5 cm, curvilinear, hemostatic with pressure  Neurological:     General: No focal deficit present.     Mental Status: He is alert.     Cranial Nerves: No cranial nerve deficit.     ED Results / Procedures / Treatments   Labs (all labs ordered are listed, but only abnormal results are displayed) Labs Reviewed  CBC WITH DIFFERENTIAL/PLATELET - Abnormal; Notable for the following components:      Result Value   RBC 3.34 (*)    Hemoglobin 10.8 (*)    HCT 34.0 (*)    MCV 101.8 (*)    All other components within normal limits  COMPREHENSIVE METABOLIC PANEL - Abnormal; Notable for the following components:   Glucose, Bld 102 (*)    BUN 38 (*)    Creatinine, Ser 2.27 (*)    Albumin 3.4 (*)    GFR calc non Af Amer 25 (*)    GFR calc Af Amer 29 (*)    All other components within normal limits  URINALYSIS, ROUTINE W REFLEX MICROSCOPIC - Abnormal; Notable for the following components:   APPearance HAZY (*)    Hgb urine dipstick SMALL (*)    Bacteria, UA RARE (*)    All other components within normal limits  PROTIME-INR - Abnormal; Notable for the following components:   Prothrombin Time 28.2 (*)    INR 2.8 (*)    All other components within normal limits  I-STAT CHEM 8, ED - Abnormal; Notable for the following components:   BUN 37 (*)    Creatinine, Ser 2.50 (*)    Hemoglobin 10.9 (*)    HCT 32.0 (*)    All other components within normal limits    EKG None  Radiology No results found.  Procedures Procedures (including critical care time)  Medications Ordered in ED Medications  lactated ringers bolus 1,000 mL (0 mLs Intravenous Stopped 07/11/19 0730)    ED Course  I have reviewed the triage vital signs and the nursing  notes.  Pertinent labs & imaging results that were available during my care of the patient were reviewed by me and considered in my medical decision making (see chart for details).    MDM Rules/Calculators/A&P  Wound repaired with dermaclip and steristrips. Hemostatic. inr acceptable. No obvious injuries otherwise.   Final Clinical Impression(s) / ED Diagnoses Final diagnoses:  Fall, initial encounter  Injury of head, initial encounter  Laceration of forehead, initial encounter    Rx / DC Orders ED Discharge Orders         Ordered    Face-to-face encounter (required for Medicare/Medicaid patients)     Discontinue  Reprint    Comments: I Merrily Pew certify that this patient is under my care and that I, or a nurse practitioner or physician's assistant working with me, had a face-to-face encounter that meets the physician face-to-face encounter requirements with this patient on 07/11/2019. The encounter with the patient was in whole, or in part for the following medical condition(s) which is the primary reason for home health care (List medical condition): weakness, frequent falls. Further orders per primary provider.   07/11/19 0603           Arelie Kuzel, Corene Cornea, MD 07/13/19 0730

## 2019-09-06 ENCOUNTER — Non-Acute Institutional Stay (SKILLED_NURSING_FACILITY): Payer: Medicare Other | Admitting: Nurse Practitioner

## 2019-09-06 ENCOUNTER — Encounter: Payer: Self-pay | Admitting: Nurse Practitioner

## 2019-09-06 DIAGNOSIS — N401 Enlarged prostate with lower urinary tract symptoms: Secondary | ICD-10-CM

## 2019-09-06 DIAGNOSIS — F419 Anxiety disorder, unspecified: Secondary | ICD-10-CM

## 2019-09-06 DIAGNOSIS — F0150 Vascular dementia without behavioral disturbance: Secondary | ICD-10-CM | POA: Insufficient documentation

## 2019-09-06 DIAGNOSIS — Z86711 Personal history of pulmonary embolism: Secondary | ICD-10-CM | POA: Insufficient documentation

## 2019-09-06 DIAGNOSIS — I872 Venous insufficiency (chronic) (peripheral): Secondary | ICD-10-CM | POA: Diagnosis not present

## 2019-09-06 DIAGNOSIS — L989 Disorder of the skin and subcutaneous tissue, unspecified: Secondary | ICD-10-CM | POA: Insufficient documentation

## 2019-09-06 DIAGNOSIS — I10 Essential (primary) hypertension: Secondary | ICD-10-CM | POA: Diagnosis not present

## 2019-09-06 DIAGNOSIS — N138 Other obstructive and reflux uropathy: Secondary | ICD-10-CM | POA: Insufficient documentation

## 2019-09-06 DIAGNOSIS — F329 Major depressive disorder, single episode, unspecified: Secondary | ICD-10-CM

## 2019-09-06 DIAGNOSIS — M109 Gout, unspecified: Secondary | ICD-10-CM | POA: Diagnosis not present

## 2019-09-06 DIAGNOSIS — R296 Repeated falls: Secondary | ICD-10-CM | POA: Insufficient documentation

## 2019-09-06 DIAGNOSIS — N184 Chronic kidney disease, stage 4 (severe): Secondary | ICD-10-CM

## 2019-09-06 DIAGNOSIS — F32A Depression, unspecified: Secondary | ICD-10-CM

## 2019-09-06 DIAGNOSIS — I4891 Unspecified atrial fibrillation: Secondary | ICD-10-CM | POA: Insufficient documentation

## 2019-09-06 NOTE — Assessment & Plan Note (Signed)
Chronic, creat 2s

## 2019-09-06 NOTE — Assessment & Plan Note (Signed)
Continue Coumadin, INR 2.5 09/06/19, PT/INR 4 weeks.

## 2019-09-06 NOTE — Assessment & Plan Note (Addendum)
Chronic, severe BLE, will dc Furosemide, starts Torsemide 10mg  qd. BMP one week.

## 2019-09-06 NOTE — Assessment & Plan Note (Signed)
Close supervision/assitance for safety, ambulates with walker.

## 2019-09-06 NOTE — Assessment & Plan Note (Signed)
BPH/Urinary frequency, LUTS,  takes Finasteride, Tamsulosin,

## 2019-09-06 NOTE — Assessment & Plan Note (Signed)
Mid chest non healing scabbed over area, right tragus cutaneous horn appearance skin growth, Dermatology eval.

## 2019-09-06 NOTE — Assessment & Plan Note (Addendum)
SNF [facility] for safety, care assistance, Namanda listed as allergy, AR is constipation. Glantamine AR is dizziness.  07/11/19 CT head Chronic ischemic microangiopathy and generalized atrophy without acute intracranial abnormality.

## 2019-09-06 NOTE — Assessment & Plan Note (Addendum)
Heart rate is in control, 02/2019 EKG Afib

## 2019-09-06 NOTE — Assessment & Plan Note (Signed)
Stable, continue Allopurinol.  

## 2019-09-06 NOTE — Progress Notes (Addendum)
Location:   [place] Room Number: 8-A Place of Service:  SNF (31) Provider: Marlana Latus NP    Patient Care Team: Vernie Shanks, MD as PCP - General (Family Medicine)  Extended Emergency Contact Information Primary Emergency Contact: shawna, kiener Mobile Phone: [phone] Relation: Son Secondary Emergency Contact: jairus, tonne Mobile Phone: [phone] Relation: Daughter  Code Status:  N/A Goals of care: Advanced Directive information Advanced Directives 07/11/2019  Does Patient Have a Medical Advance Directive? No  Type of Advance Directive -  Would patient like information on creating a medical advance directive? No - Patient declined     Chief Complaint  Patient presents with  . Acute Visit    Medication Review.    HPI:  Pt is a 86 y.o. male seen today for an acute visit for review medications  Gout, stable takes Allopurinol   BPH/Urinary frequency, LUTS,  takes Finasteride, Tamsulosin,   HTN takes Furosemide 20mg  qd  Edema PVD, no open wounds, takes Furosemide.   AFib, heart rate is in control  CKD stage 4  Frequent falls in the past  Dementia, Namanda listed as allergy, AR is constipation. Glantamine AR is dizziness.   Hx of PE, on Coumadin, INR 2.5 09/06/19   Past Medical History:  Diagnosis Date  . Aortic atherosclerosis ([place])   . Arthritis   . Chronic kidney disease   . DVT (deep venous thrombosis) (HCC)    hx  . Gout   . Hypertension   . Low back pain   . Memory deficit   . PE (pulmonary embolism)    hx  . Shortness of breath   . Vitamin D deficiency   . Wears glasses    Past Surgical History:  Procedure Laterality Date  . BACK SURGERY  1990   lumb lam  . CHOLECYSTECTOMY  2008   lap choli with umb HR  . COLONOSCOPY    . REPAIR EXTENSOR TENDON Right 05/26/2012   Procedure: RECONSTRUCTION EXTENSOR HOOD RIGHT MIDDLE FINGER;  Surgeon: Wynonia Sours, MD;  Location: [facility];  Service: Orthopedics;   Laterality: Right;  . TENDON REPAIR  1990   left arm  . TONSILLECTOMY    . UMBILICAL HERNIA REPAIR  2008  . UPPER GI ENDOSCOPY    . VOCAL CORD LATERALIZATION, ENDOSCOPIC APPROACH W/ MLB     polyp    Allergies  Allergen Reactions  . Galantamine     Other reaction(s): Dizziness (intolerance)  . Memantine     Other reaction(s): Other (See Comments) CONSTIPATION    Allergies as of 09/06/2019      Reactions   Galantamine    Other reaction(s): Dizziness (intolerance)   Memantine    Other reaction(s): Other (See Comments) CONSTIPATION      Medication List       Accurate as of September 06, 2019 11:59 PM. If you have any questions, ask your nurse or doctor.        STOP taking these medications   allopurinol 100 MG tablet Commonly known as: ZYLOPRIM Stopped by: Bodhi Stenglein X Darrah Dredge, NP   b complex vitamins capsule Stopped by: Dmario Russom X Adelisa Satterwhite, NP   finasteride 5 MG tablet Commonly known as: PROSCAR Stopped by: Kimyah Frein X Akif Weldy, NP   furosemide 20 MG tablet Commonly known as: LASIX Stopped by: Kurstin Dimarzo X Taylinn Brabant, NP   ketoconazole 2 % cream Commonly known as: NIZORAL Stopped by: Andrey Hoobler X Haruye Lainez, NP   tamsulosin 0.4 MG Caps capsule  Commonly known as: FLOMAX Stopped by: Trinna Kunst X Jamison Yuhasz, NP   VITAMIN D3 PO Stopped by: Bodi Palmeri X Dejae Bernet, NP   warfarin 3 MG tablet Commonly known as: COUMADIN Stopped by: Miyuki Rzasa X Jojuan Champney, NP     TAKE these medications   zinc oxide 20 % ointment Apply 1 application topically as needed for irritation.       Review of Systems  Constitutional: Negative for activity change, appetite change and fever.  HENT: Positive for hearing loss. Negative for congestion and voice change.   Respiratory: Negative for cough, chest tightness, shortness of breath and wheezing.   Cardiovascular: Positive for leg swelling. Negative for chest pain and palpitations.  Gastrointestinal: Negative for abdominal pain, constipation, nausea and vomiting.  Genitourinary: Positive for frequency. Negative for  difficulty urinating, dysuria and hematuria.  Musculoskeletal: Positive for arthralgias, back pain and gait problem.  Skin: Negative for color change and pallor.  Neurological: Negative for dizziness, weakness and headaches.  Psychiatric/Behavioral: Negative for agitation, behavioral problems, hallucinations and sleep disturbance. The patient is not nervous/anxious.     Immunization History  Administered Date(s) Administered  . PFIZER SARS-COV-2 Vaccination 01/30/2019, 02/20/2019  . Tdap 02/22/2019   Pertinent  Health Maintenance Due  Topic Date Due  . PNA vac Low Risk Adult (1 of 2 - PCV13) Never done  . INFLUENZA VACCINE  08/15/2019   No flowsheet data found. Functional Status Survey:    Vitals:   09/06/19 1428  BP: 104/70  Pulse: 93  Weight: 167 lb (75.8 kg)  Height: 5\' 9"  (1.753 m)   Body mass index is 24.66 kg/m. Physical Exam Vitals and nursing note reviewed.  Constitutional:      Appearance: Normal appearance.  HENT:     Head: Normocephalic and atraumatic.     Nose: Nose normal.     Mouth/Throat:     Mouth: Mucous membranes are moist.  Eyes:     Extraocular Movements: Extraocular movements intact.     Conjunctiva/sclera: Conjunctivae normal.     Pupils: Pupils are equal, round, and reactive to light.  Cardiovascular:     Rate and Rhythm: Normal rate and regular rhythm.     Heart sounds: No murmur heard.   Pulmonary:     Effort: Pulmonary effort is normal.     Breath sounds: No wheezing or rales.  Abdominal:     General: Bowel sounds are normal.     Palpations: Abdomen is soft.     Tenderness: There is no abdominal tenderness. There is guarding. There is no rebound.  Musculoskeletal:     Cervical back: Normal range of motion and neck supple.     Right lower leg: Edema present.     Left lower leg: Edema present.     Comments: 3+ edema BLE  Skin:    General: Skin is warm and dry.     Findings: No bruising or rash.     Comments: Skin lesions mid chest,  long standing, non healing. Right tragus cutaneous horn appearance.   Neurological:     General: No focal deficit present.     Mental Status: He is alert. Mental status is at baseline.     Cranial Nerves: No cranial nerve deficit.     Motor: No weakness.     Coordination: Coordination normal.     Gait: Gait abnormal.     Comments: Oriented to person, place.   Psychiatric:        Mood and Affect: Mood normal.  Behavior: Behavior normal.     Labs reviewed: Recent Labs    02/16/19 1116 02/22/19 0641 02/22/19 0657 07/11/19 0450 07/11/19 0511  NA 134*   < > 130* 139 137  K 3.9   < > 5.1 4.0 4.1  CL 102   < > 97* 102 103  CO2 23  --  19*  --  24  GLUCOSE 114*   < > 92 97 102*  BUN 48*   < > 41* 37* 38*  CREATININE 2.85*   < > 2.65* 2.50* 2.27*  CALCIUM 8.8*  --  8.8*  --  9.5   < > = values in this interval not displayed.   Recent Labs    07/11/19 0511  AST 26  ALT 24  ALKPHOS 64  BILITOT 0.5  PROT 6.8  ALBUMIN 3.4*   Recent Labs    02/16/19 1116 02/22/19 0641 02/22/19 0657 07/11/19 0450 07/11/19 0511  WBC 6.4  --  6.0  --  6.9  NEUTROABS  --   --   --   --  4.4  HGB 10.2*   < > 10.8* 10.9* 10.8*  HCT 31.3*   < > 34.0* 32.0* 34.0*  MCV 102.0*  --  103.0*  --  101.8*  PLT 194  --  173  --  216   < > = values in this interval not displayed.   No results found for: TSH No results found for: HGBA1C No results found for: CHOL, HDL, LDLCALC, LDLDIRECT, TRIG, CHOLHDL  Significant Diagnostic Results in last 30 days:  No results found.  Assessment/Plan Gout Stable, continue Allopurinol.   BPH with obstruction/lower urinary tract symptoms BPH/Urinary frequency, LUTS,  takes Finasteride, Tamsulosin,    HTN (hypertension) Blood pressure is controlled, continue Furosemide.   Edema of both lower extremities due to peripheral venous insufficiency Chronic, severe BLE, will dc Furosemide, starts Torsemide 10mg  qd. BMP one week.   CKD (chronic kidney  disease) stage 4, GFR 15-29 ml/min (HCC) Chronic, creat 2s  History of pulmonary embolism Continue Coumadin, INR 2.5 09/06/19, PT/INR 4 weeks.   Vascular dementia [facility]) SNF [facility] for safety, care assistance, Namanda listed as allergy, AR is constipation. Glantamine AR is dizziness.  07/11/19 CT head Chronic ischemic microangiopathy and generalized atrophy without acute intracranial abnormality.  Frequent falls Close supervision/assitance for safety, ambulates with walker.   A-fib ([place]) Heart rate is in control, 02/2019 EKG Afib  Skin lesion Mid chest non healing scabbed over area, right tragus cutaneous horn appearance skin growth, Dermatology eval.   Anxiety and depression Yelling, emotional outburst in the past a few months since admitting to SNF [facility] initiated, difficulty adjusting to SNF [facility]. May consider SSRI if problem persists. Will have Lorazepam 0.5 mg q6 prn for mild anxiety, 1mg  q6hr prn for moderate anxiety. Observe.     Family/ staff Communication: plan of care reviewed with the patient and charge nurse.   Labs/tests ordered:  PT/INR today.   Time spend 35 minutes.

## 2019-09-06 NOTE — Assessment & Plan Note (Signed)
Blood pressure is controlled, continue Furosemide.  

## 2019-09-07 ENCOUNTER — Encounter: Payer: Self-pay | Admitting: Nurse Practitioner

## 2019-09-07 DIAGNOSIS — F419 Anxiety disorder, unspecified: Secondary | ICD-10-CM | POA: Insufficient documentation

## 2019-09-07 NOTE — Assessment & Plan Note (Signed)
Yelling, emotional outburst in the past a few months since admitting to SNF [facility] initiated, difficulty adjusting to SNF [facility]. May consider SSRI if problem persists. Will have Lorazepam 0.5 mg q6 prn for mild anxiety, 1mg  q6hr prn for moderate anxiety. Observe.

## 2019-09-08 ENCOUNTER — Encounter: Payer: Self-pay | Admitting: Internal Medicine

## 2019-09-08 ENCOUNTER — Non-Acute Institutional Stay (SKILLED_NURSING_FACILITY): Payer: Medicare Other | Admitting: Internal Medicine

## 2019-09-08 DIAGNOSIS — F0151 Vascular dementia with behavioral disturbance: Secondary | ICD-10-CM

## 2019-09-08 DIAGNOSIS — I872 Venous insufficiency (chronic) (peripheral): Secondary | ICD-10-CM | POA: Diagnosis not present

## 2019-09-08 DIAGNOSIS — N401 Enlarged prostate with lower urinary tract symptoms: Secondary | ICD-10-CM

## 2019-09-08 DIAGNOSIS — N184 Chronic kidney disease, stage 4 (severe): Secondary | ICD-10-CM | POA: Diagnosis not present

## 2019-09-08 DIAGNOSIS — N138 Other obstructive and reflux uropathy: Secondary | ICD-10-CM

## 2019-09-08 DIAGNOSIS — Z86711 Personal history of pulmonary embolism: Secondary | ICD-10-CM

## 2019-09-08 DIAGNOSIS — I10 Essential (primary) hypertension: Secondary | ICD-10-CM

## 2019-09-08 DIAGNOSIS — R296 Repeated falls: Secondary | ICD-10-CM

## 2019-09-08 DIAGNOSIS — F01518 Vascular dementia, unspecified severity, with other behavioral disturbance: Secondary | ICD-10-CM

## 2019-09-08 DIAGNOSIS — M109 Gout, unspecified: Secondary | ICD-10-CM

## 2019-09-08 NOTE — Progress Notes (Signed)
Provider:  Veleta Miners MD Location:    [facility] [place] Room Number: 8 Place of Service:  SNF [phone])  PCP: Vernie Shanks, MD Patient Care Team: Vernie Shanks, MD as PCP - General (Family Medicine)  Extended Emergency Contact Information Primary Emergency Contact: leshawn, straka Mobile Phone: [phone] Relation: Son Secondary Emergency Contact: hazen, brumett Mobile Phone: [phone] Relation: Daughter  Code Status: DNR Goals of Care: Advanced Directive information Advanced Directives 09/08/2019  Does Patient Have a Medical Advance Directive? Yes  Type of Advance Directive Out of facility DNR (pink MOST or yellow form);[place];Living will  Does patient want to make changes to medical advance directive? No - Patient declined  Copy of [place] in Chart? No - copy requested  Would patient like information on creating a medical advance directive? -  Pre-existing out of facility DNR order (yellow form or pink MOST form) Yellow form placed in chart (order not valid for inpatient use)      Chief Complaint  Patient presents with  . New Admit To SNF    Admission    HPI: Patient is a 86 y.o. 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 Since he has been here has had 3 falls.  Has been agitated in the evening. Trying to Leave the facility. Trying to get to his car. Nurses have been using Ativan to calm him. He was sleepy this morning.  Denied any complains to me. No SOB or Cough Does  have edema in her Legs   Past Medical History:  Diagnosis Date  . Aortic atherosclerosis ([place])   . Arthritis   . Chronic kidney disease   . DVT (deep venous thrombosis) (HCC)    hx  . Gout   . Hypertension   . Low back pain   . Memory deficit   . PE (pulmonary embolism)    hx  . Shortness of breath   . Vitamin D deficiency   . Wears glasses    Past Surgical History:  Procedure Laterality Date  . BACK SURGERY  1990   lumb lam  . CHOLECYSTECTOMY  2008   lap choli with umb HR  . COLONOSCOPY    . REPAIR EXTENSOR TENDON Right 05/26/2012   Procedure: RECONSTRUCTION EXTENSOR HOOD RIGHT MIDDLE FINGER;  Surgeon: Wynonia Sours, MD;  Location: [facility];  Service: Orthopedics;  Laterality: Right;  . TENDON REPAIR  1990   left arm  . TONSILLECTOMY    . UMBILICAL HERNIA REPAIR  2008  . UPPER GI ENDOSCOPY    . VOCAL CORD LATERALIZATION, ENDOSCOPIC APPROACH W/ MLB     polyp    reports that he has never smoked. He has never used smokeless tobacco. He reports that he does not drink alcohol and does not use drugs. Social History   Socioeconomic History  . Marital status: Married    Spouse name: Not on file  . Number of children: 2  . Years of education: 45  .  Highest education level: Not on file  Occupational History  . Not on file  Tobacco Use  . Smoking status: Never Smoker  . Smokeless tobacco: Never Used  Vaping Use  . Vaping Use: Never used  Substance and Sexual Activity  . Alcohol use: No  . Drug use: No  . Sexual activity: Not on file  Other Topics Concern  . Not on file  Social History Narrative   Lives at home with his wife   Right handed   1 cup of caffeine daily   Social Determinants of Health   Financial Resource Strain:   . Difficulty of Paying Living Expenses: Not on file  Food Insecurity:   . Worried About Charity fundraiser in the Last Year: Not on file  . Ran Out of Food in the Last Year: Not on file  Transportation Needs:   . Lack  of Transportation (Medical): Not on file  . Lack of Transportation (Non-Medical): Not on file  Physical Activity:   . Days of Exercise per Week: Not on file  . Minutes of Exercise per Session: Not on file  Stress:   . Feeling of Stress : Not on file  Social Connections:   . Frequency of Communication with Friends and Family: Not on file  . Frequency of Social Gatherings with Friends and Family: Not on file  . Attends Religious Services: Not on file  . Active Member of Clubs or Organizations: Not on file  . Attends Archivist Meetings: Not on file  . Marital Status: Not on file  Intimate Partner Violence:   . Fear of Current or Ex-Partner: Not on file  . Emotionally Abused: Not on file  . Physically Abused: Not on file  . Sexually Abused: Not on file    Functional Status Survey:    Family History  Problem Relation Age of Onset  . Lung cancer Mother   . Stroke Father   . Lung cancer Sister     Health Maintenance  Topic Date Due  . PNA vac Low Risk Adult (1 of 2 - PCV13) Never done  . INFLUENZA VACCINE  08/15/2019  . TETANUS/TDAP  02/21/2029  . COVID-19 Vaccine  Completed    Allergies  Allergen Reactions  . Galantamine     Other reaction(s): Dizziness (intolerance)  . Memantine     Other reaction(s): Other (See Comments) CONSTIPATION    Allergies as of 09/08/2019      Reactions   Galantamine    Other reaction(s): Dizziness (intolerance)   Memantine    Other reaction(s): Other (See Comments) CONSTIPATION      Medication List       Accurate as of September 08, 2019 10:28 AM. If you have any questions, ask your nurse or doctor.        allopurinol 100 MG tablet Commonly known as: ZYLOPRIM Take 100 mg by mouth daily.   B COMPLEX PO Take by mouth daily.   finasteride 5 MG tablet Commonly known as: PROSCAR Take 5 mg by mouth daily.   ketoconazole 2 % cream Commonly known as: NIZORAL Apply 1 application topically 2 (two) times daily.     LORazepam 0.5 MG tablet Commonly known as: ATIVAN Take 0.5 mg by mouth every 6 (six) hours as needed for anxiety.   LORazepam 1 MG tablet Commonly known as: ATIVAN Take 1 mg by mouth every 6 (six) hours as needed for anxiety.   tamsulosin 0.4 MG Caps capsule Commonly known as: FLOMAX  Take 0.4 mg by mouth daily.   torsemide 10 MG tablet Commonly known as: DEMADEX Take 10 mg by mouth daily.   Vitamin D 50 MCG (2000 UT) tablet Take 2,000 Units by mouth daily.   warfarin 3 MG tablet Commonly known as: COUMADIN Take 1.5 mg by mouth daily. Tuesday, Thursday, Saturdays and Sundays   warfarin 3 MG tablet Commonly known as: COUMADIN Take 3 mg by mouth daily. Mondays, Wednesdays and Fridays.   zinc oxide 20 % ointment Apply 1 application topically as needed for irritation.       Review of Systems  Unable to perform ROS: Dementia    Vitals:   09/08/19 1013  BP: 118/69  Pulse: 88  Resp: 19  Temp: 98.7 F (37.1 C)  SpO2: 95%  Weight: 174 lb 12.8 oz (79.3 kg)  Height: 5' 8.5" (1.74 m)   Body mass index is 26.19 kg/m. Physical Exam Vitals reviewed.  Constitutional:      Appearance: Normal appearance.  HENT:     Head: Normocephalic.     Nose: Nose normal.     Mouth/Throat:     Mouth: Mucous membranes are moist.     Pharynx: Oropharynx is clear.  Eyes:     Pupils: Pupils are equal, round, and reactive to light.  Cardiovascular:     Rate and Rhythm: Normal rate and regular rhythm.     Pulses: Normal pulses.  Pulmonary:     Effort: Pulmonary effort is normal. No respiratory distress.     Breath sounds: Normal breath sounds. No wheezing.  Abdominal:     General: Abdomen is flat. Bowel sounds are normal.     Palpations: Abdomen is soft.  Musculoskeletal:        General: Swelling present.     Cervical back: Neck supple.  Skin:    General: Skin is warm and dry.  Neurological:     General: No focal deficit present.     Mental Status: He is alert.     Comments:  Oriented to Self. Did not know the name of the place or his Age  Psychiatric:        Thought Content: Thought content normal.     Labs reviewed: Basic Metabolic Panel: Recent Labs    02/16/19 1116 02/22/19 0641 02/22/19 0657 07/11/19 0450 07/11/19 0511  NA 134*   < > 130* 139 137  K 3.9   < > 5.1 4.0 4.1  CL 102   < > 97* 102 103  CO2 23  --  19*  --  24  GLUCOSE 114*   < > 92 97 102*  BUN 48*   < > 41* 37* 38*  CREATININE 2.85*   < > 2.65* 2.50* 2.27*  CALCIUM 8.8*  --  8.8*  --  9.5   < > = values in this interval not displayed.   Liver Function Tests: Recent Labs    07/11/19 0511  AST 26  ALT 24  ALKPHOS 64  BILITOT 0.5  PROT 6.8  ALBUMIN 3.4*   No results for input(s): LIPASE, AMYLASE in the last 8760 hours. No results for input(s): AMMONIA in the last 8760 hours. CBC: Recent Labs    02/16/19 1116 02/22/19 0641 02/22/19 0657 07/11/19 0450 07/11/19 0511  WBC 6.4  --  6.0  --  6.9  NEUTROABS  --   --   --   --  4.4  HGB 10.2*   < > 10.8* 10.9* 10.8*  HCT 31.3*   < >  34.0* 32.0* 34.0*  MCV 102.0*  --  103.0*  --  101.8*  PLT 194  --  173  --  216   < > = values in this interval not displayed.   Cardiac Enzymes: No results for input(s): CKTOTAL, CKMB, CKMBINDEX, TROPONINI in the last 8760 hours. BNP: Invalid input(s): POCBNP No results found for: HGBA1C No results found for: TSH No results found for: VITAMINB12 No results found for: FOLATE No results found for: IRON, TIBC, FERRITIN  Imaging and Procedures obtained prior to SNF admission: CT Head Wo Contrast  Result Date: 07/11/2019 CLINICAL DATA:  Fall.  Headache. EXAM: CT HEAD WITHOUT CONTRAST CT CERVICAL SPINE WITHOUT CONTRAST TECHNIQUE: Multidetector CT imaging of the head and cervical spine was performed following the standard protocol without intravenous contrast. Multiplanar CT image reconstructions of the cervical spine were also generated. COMPARISON:  None. FINDINGS: CT HEAD FINDINGS Brain:  There is no mass, hemorrhage or extra-axial collection. There is generalized atrophy without lobar predilection. There is hypoattenuation of the periventricular white matter, most commonly indicating chronic ischemic microangiopathy. Vascular: Atherosclerotic calcification of the vertebral and internal carotid arteries at the skull base. No abnormal hyperdensity of the major intracranial arteries or dural venous sinuses. Skull: The visualized skull base, calvarium and extracranial soft tissues are normal. Sinuses/Orbits: No fluid levels or advanced mucosal thickening of the visualized paranasal sinuses. No mastoid or middle ear effusion. The orbits are normal. CT CERVICAL SPINE FINDINGS Alignment: No static subluxation. Facets are aligned. Occipital condyles are normally positioned. Reversal of normal cervical lordosis may be positional or due to muscle spasm. Skull base and vertebrae: No acute fracture. Soft tissues and spinal canal: No prevertebral fluid or swelling. No visible canal hematoma. Disc levels: Moderate bilateral C3-6 neural foraminal stenosis. Upper chest: No pneumothorax, pulmonary nodule or pleural effusion. Other: Normal visualized paraspinal cervical soft tissues. IMPRESSION: 1. Chronic ischemic microangiopathy and generalized atrophy without acute intracranial abnormality. 2. No acute fracture or static subluxation of the cervical spine. Electronically Signed   By: Ulyses Jarred M.D.   On: 07/11/2019 05:59   CT Cervical Spine Wo Contrast  Result Date: 07/11/2019 CLINICAL DATA:  Fall.  Headache. EXAM: CT HEAD WITHOUT CONTRAST CT CERVICAL SPINE WITHOUT CONTRAST TECHNIQUE: Multidetector CT imaging of the head and cervical spine was performed following the standard protocol without intravenous contrast. Multiplanar CT image reconstructions of the cervical spine were also generated. COMPARISON:  None. FINDINGS: CT HEAD FINDINGS Brain: There is no mass, hemorrhage or extra-axial collection. There  is generalized atrophy without lobar predilection. There is hypoattenuation of the periventricular white matter, most commonly indicating chronic ischemic microangiopathy. Vascular: Atherosclerotic calcification of the vertebral and internal carotid arteries at the skull base. No abnormal hyperdensity of the major intracranial arteries or dural venous sinuses. Skull: The visualized skull base, calvarium and extracranial soft tissues are normal. Sinuses/Orbits: No fluid levels or advanced mucosal thickening of the visualized paranasal sinuses. No mastoid or middle ear effusion. The orbits are normal. CT CERVICAL SPINE FINDINGS Alignment: No static subluxation. Facets are aligned. Occipital condyles are normally positioned. Reversal of normal cervical lordosis may be positional or due to muscle spasm. Skull base and vertebrae: No acute fracture. Soft tissues and spinal canal: No prevertebral fluid or swelling. No visible canal hematoma. Disc levels: Moderate bilateral C3-6 neural foraminal stenosis. Upper chest: No pneumothorax, pulmonary nodule or pleural effusion. Other: Normal visualized paraspinal cervical soft tissues. IMPRESSION: 1. Chronic ischemic microangiopathy and generalized atrophy without acute intracranial abnormality. 2.  No acute fracture or static subluxation of the cervical spine. Electronically Signed   By: Ulyses Jarred M.D.   On: 07/11/2019 05:59    Assessment/Plan CKD (chronic kidney disease) stage 4, GFR 15-29 ml/min (HCC) Followed with nephrology in [place] has been stable for past few years Edema of both lower extremities  Lasix changed to Torsemide Repeat BMP pending  BPH with obstruction/lower urinary tract symptoms Has refused Foley as recommended by Urology for Urinary retention Managed with Flomax and Proscar  History of pulmonary embolism On Chronic Coumadin Gout,  On Allopurinal Vascular dementia with behavior disturbance ([place]) CT scan showed  Microangiopathy Main issue right now Nurses using Ativan to calm him in the Evening Will start him on Zoloft to help with Depression  Is Allergic to Namenda Also start on Seroquel 12.5 mg BID Try to avoid Ativan due to Falls Frequent falls Has h/o Postural Hypotension and Poor Awareness Will continue Therapy Constipation Start on Senna Plus QHS      Family/ staff Communication:   Labs/tests ordered:

## 2019-09-15 ENCOUNTER — Non-Acute Institutional Stay (SKILLED_NURSING_FACILITY): Payer: Medicare Other | Admitting: Internal Medicine

## 2019-09-15 ENCOUNTER — Encounter: Payer: Self-pay | Admitting: Internal Medicine

## 2019-09-15 DIAGNOSIS — F0151 Vascular dementia with behavioral disturbance: Secondary | ICD-10-CM

## 2019-09-15 DIAGNOSIS — N184 Chronic kidney disease, stage 4 (severe): Secondary | ICD-10-CM | POA: Diagnosis not present

## 2019-09-15 DIAGNOSIS — R296 Repeated falls: Secondary | ICD-10-CM

## 2019-09-15 DIAGNOSIS — I872 Venous insufficiency (chronic) (peripheral): Secondary | ICD-10-CM | POA: Diagnosis not present

## 2019-09-15 DIAGNOSIS — N401 Enlarged prostate with lower urinary tract symptoms: Secondary | ICD-10-CM | POA: Diagnosis not present

## 2019-09-15 DIAGNOSIS — F01518 Vascular dementia, unspecified severity, with other behavioral disturbance: Secondary | ICD-10-CM

## 2019-09-15 DIAGNOSIS — I10 Essential (primary) hypertension: Secondary | ICD-10-CM

## 2019-09-15 DIAGNOSIS — Z86711 Personal history of pulmonary embolism: Secondary | ICD-10-CM

## 2019-09-15 DIAGNOSIS — M109 Gout, unspecified: Secondary | ICD-10-CM

## 2019-09-15 DIAGNOSIS — N138 Other obstructive and reflux uropathy: Secondary | ICD-10-CM

## 2019-09-15 NOTE — Progress Notes (Signed)
Location: [place] Room Number: 8 Place of Service:  SNF (31)  Provider:   Code Status:  Goals of Care:  Advanced Directives 09/08/2019  Does Patient Have a Medical Advance Directive? Yes  Type of Advance Directive Out of facility DNR (pink MOST or yellow form);[place];Living will  Does patient want to make changes to medical advance directive? No - Patient declined  Copy of [place] in Chart? No - copy requested  Would patient like information on creating a medical advance directive? -  Pre-existing out of facility DNR order (yellow form or pink MOST form) Yellow form placed in chart (order not valid for inpatient use)     Chief Complaint  Patient presents with  . Acute Visit    Behavior issues    HPI: Patient is a 86 y.o. male seen today for an acute visit for Follow up of his behavior Recently move to SNF from home.  Where he was living with his wife.  He has 2 sons who live in [place].  His wife is unable to take care of him because of recurrent falls.  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  Patient was having behavior issues including agitation and trying to leave the facility.  He also had 3 falls. He was started on Seroquel and Zoloft.  Since then patient has calmed down.  He has not had any falls.  Per nurses they had not been using any Ativan or as needed Seroquel. He was very alert today answers me appropriately.  He did not know where he was.  He said his wife is on her way to pick him and take him home. Patient son wanted to discuss his medications with me and wanted to make sure he was not oversedated.  Past Medical History:  Diagnosis Date  . Aortic atherosclerosis  ([place])   . Arthritis   . Chronic kidney disease   . DVT (deep venous thrombosis) (HCC)    hx  . Gout   . Hypertension   . Low back pain   . Memory deficit   . PE (pulmonary embolism)    hx  . Shortness of breath   . Vitamin D deficiency   . Wears glasses     Past Surgical History:  Procedure Laterality Date  . BACK SURGERY  1990   lumb lam  . CHOLECYSTECTOMY  2008   lap choli with umb HR  . COLONOSCOPY    . REPAIR EXTENSOR TENDON Right 05/26/2012   Procedure: RECONSTRUCTION EXTENSOR HOOD RIGHT MIDDLE FINGER;  Surgeon: Wynonia Sours, MD;  Location: [facility];  Service: Orthopedics;  Laterality: Right;  . TENDON REPAIR  1990   left arm  . TONSILLECTOMY    . UMBILICAL HERNIA REPAIR  2008  . UPPER GI ENDOSCOPY    . VOCAL CORD LATERALIZATION, ENDOSCOPIC APPROACH W/ MLB     polyp    Allergies  Allergen Reactions  . Galantamine     Other reaction(s): Dizziness (intolerance)  . Memantine     Other reaction(s): Other (See Comments) CONSTIPATION    Outpatient Encounter Medications as of 09/15/2019  Medication Sig  . acetaminophen (TYLENOL) 325 MG tablet Take 650 mg by mouth every  4 (four) hours as needed.  Marland Kitchen allopurinol (ZYLOPRIM) 100 MG tablet Take 100 mg by mouth daily.  . B Complex Vitamins (B COMPLEX PO) Take by mouth daily.  . Cholecalciferol (VITAMIN D) 50 MCG (2000 UT) tablet Take 2,000 Units by mouth daily.  . finasteride (PROSCAR) 5 MG tablet Take 5 mg by mouth daily.  Marland Kitchen ketoconazole (NIZORAL) 2 % cream Apply 1 application topically 2 (two) times daily.  Marland Kitchen LORazepam (ATIVAN) 0.5 MG tablet Take 0.5 mg by mouth every 6 (six) hours as needed for anxiety.  Marland Kitchen LORazepam (ATIVAN) 1 MG tablet Take 1 mg by mouth every 6 (six) hours as needed for anxiety.  . melatonin 3 MG TABS tablet Take 3 mg by mouth at bedtime.  Marland Kitchen QUEtiapine (SEROQUEL) 25 MG tablet Take 12.5 mg by mouth 2 (two) times daily.  . sennosides-docusate sodium (SENOKOT-S) 8.6-50 MG tablet Take 2  tablets by mouth daily.  . tamsulosin (FLOMAX) 0.4 MG CAPS capsule Take 0.4 mg by mouth daily.  Marland Kitchen torsemide (DEMADEX) 10 MG tablet Take 10 mg by mouth daily.  Derrill Memo ON 09/16/2019] tuberculin (TUBERSOL) 5 UNIT/0.1ML injection Inject into the skin once.  . warfarin (COUMADIN) 3 MG tablet Take 1.5 mg by mouth daily. Tuesday, Thursday, Saturdays and Sundays  . warfarin (COUMADIN) 3 MG tablet Take 3 mg by mouth daily. Mondays, Wednesdays and Fridays.  Marland Kitchen zinc oxide 20 % ointment Apply 1 application topically as needed for irritation.   No facility-administered encounter medications on file as of 09/15/2019.    Review of Systems:  Review of Systems  Unable to perform ROS: Dementia    Health Maintenance  Topic Date Due  . PNA vac Low Risk Adult (1 of 2 - PCV13) Never done  . INFLUENZA VACCINE  Never done  . TETANUS/TDAP  02/21/2029  . COVID-19 Vaccine  Completed    Physical Exam: Vitals:   09/15/19 1057  BP: (!) 158/80  Pulse: 73  Resp: 18  Temp: 98 F (36.7 C)  SpO2: 96%  Weight: 170 lb (77.1 kg)  Height: 5' 8.5" (1.74 m)   Body mass index is 25.47 kg/m. Physical Exam Vitals reviewed.  Constitutional:      Appearance: Normal appearance.  HENT:     Head: Normocephalic.     Nose: Nose normal.     Mouth/Throat:     Mouth: Mucous membranes are moist.     Pharynx: Oropharynx is clear.  Eyes:     Pupils: Pupils are equal, round, and reactive to light.  Cardiovascular:     Rate and Rhythm: Normal rate and regular rhythm.     Pulses: Normal pulses.     Heart sounds: Normal heart sounds.  Pulmonary:     Effort: Pulmonary effort is normal. No respiratory distress.     Breath sounds: Normal breath sounds. No wheezing or rales.  Abdominal:     General: Abdomen is flat. Bowel sounds are normal.     Palpations: Abdomen is soft.  Musculoskeletal:        General: Swelling present.     Cervical back: Neck supple.  Skin:    General: Skin is warm and dry.  Neurological:      General: No focal deficit present.     Mental Status: He is alert.     Comments: Oriented to self  Psychiatric:        Mood and Affect: Mood normal.     Labs reviewed: Basic Metabolic Panel: Recent Labs  02/16/19 1116 02/22/19 0641 02/22/19 0657 07/11/19 0450 07/11/19 0511  NA 134*   < > 130* 139 137  K 3.9   < > 5.1 4.0 4.1  CL 102   < > 97* 102 103  CO2 23  --  19*  --  24  GLUCOSE 114*   < > 92 97 102*  BUN 48*   < > 41* 37* 38*  CREATININE 2.85*   < > 2.65* 2.50* 2.27*  CALCIUM 8.8*  --  8.8*  --  9.5   < > = values in this interval not displayed.   Liver Function Tests: Recent Labs    07/11/19 0511  AST 26  ALT 24  ALKPHOS 64  BILITOT 0.5  PROT 6.8  ALBUMIN 3.4*   No results for input(s): LIPASE, AMYLASE in the last 8760 hours. No results for input(s): AMMONIA in the last 8760 hours. CBC: Recent Labs    02/16/19 1116 02/22/19 0641 02/22/19 0657 07/11/19 0450 07/11/19 0511  WBC 6.4  --  6.0  --  6.9  NEUTROABS  --   --   --   --  4.4  HGB 10.2*   < > 10.8* 10.9* 10.8*  HCT 31.3*   < > 34.0* 32.0* 34.0*  MCV 102.0*  --  103.0*  --  101.8*  PLT 194  --  173  --  216   < > = values in this interval not displayed.   Lipid Panel: No results for input(s): CHOL, HDL, LDLCALC, TRIG, CHOLHDL, LDLDIRECT in the last 8760 hours. No results found for: HGBA1C  Procedures since last visit: No results found.  Assessment/Plan  Vascular dementia with behavior disturbance (HCC) CT scan showed Microangiopathy D/W Son Will discontinue Ativan. Just Continue PRN 0.5 mg QD Also PRN Seroquel for agitation Continue Seroquel 12.5 mg BID for now Reval in week to see if it can taper off Continue on Zoloft Doing well right now  CKD (chronic kidney disease) stage 4, GFR 15-29 ml/min (HCC) Some worsening on Torsemide and possible due to decrease intake Reval BMP in 1 week Has Nephrology Follow up schedule in Dec Edema of both lower extremities due to peripheral  venous insufficiency Much Better on Torsemide Has lost 5 lbs BPH with obstruction/lower urinary tract symptoms Has refused Foley as recommended by urology for urinary retention Managed with Flomax and Proscar Hypertension, unspecified type Blood pressure mildly elevated today Treating conservatively due to his history of orthostasis  History of pulmonary embolism Continue on Coumadin Check PT/INR  Gout,  Doing well on allopurinol  frequent falls History of postural hypotension and poor awareness Continue therapy  Discussed in Detail with his son Labs/tests ordered:  BMP,CBC,PT/INR Next appt:  Visit date not found  Total time spent in this patient care encounter was  45_  minutes; greater than 50% of the visit spent counseling patient and staff, reviewing records , Labs and coordinating care for problems addressed at this encounter.

## 2019-09-16 LAB — CBC AND DIFFERENTIAL
HCT: 28 — AB (ref 41–53)
Hemoglobin: 9.3 — AB (ref 13.5–17.5)
Platelets: 268 (ref 150–399)
WBC: 6.2

## 2019-09-16 LAB — TSH: TSH: 1.93 (ref 0.41–5.90)

## 2019-09-16 LAB — CBC: RBC: 2.81 — AB (ref 3.87–5.11)

## 2019-09-17 ENCOUNTER — Non-Acute Institutional Stay (SKILLED_NURSING_FACILITY): Payer: Medicare Other | Admitting: Nurse Practitioner

## 2019-09-17 ENCOUNTER — Encounter: Payer: Self-pay | Admitting: Nurse Practitioner

## 2019-09-17 DIAGNOSIS — I4891 Unspecified atrial fibrillation: Secondary | ICD-10-CM | POA: Diagnosis not present

## 2019-09-17 DIAGNOSIS — F0151 Vascular dementia with behavioral disturbance: Secondary | ICD-10-CM

## 2019-09-17 DIAGNOSIS — I872 Venous insufficiency (chronic) (peripheral): Secondary | ICD-10-CM

## 2019-09-17 DIAGNOSIS — N184 Chronic kidney disease, stage 4 (severe): Secondary | ICD-10-CM

## 2019-09-17 DIAGNOSIS — F329 Major depressive disorder, single episode, unspecified: Secondary | ICD-10-CM

## 2019-09-17 DIAGNOSIS — D638 Anemia in other chronic diseases classified elsewhere: Secondary | ICD-10-CM

## 2019-09-17 DIAGNOSIS — F01518 Vascular dementia, unspecified severity, with other behavioral disturbance: Secondary | ICD-10-CM

## 2019-09-17 DIAGNOSIS — F419 Anxiety disorder, unspecified: Secondary | ICD-10-CM

## 2019-09-17 DIAGNOSIS — Z86711 Personal history of pulmonary embolism: Secondary | ICD-10-CM

## 2019-09-17 DIAGNOSIS — R296 Repeated falls: Secondary | ICD-10-CM

## 2019-09-17 NOTE — Assessment & Plan Note (Signed)
F/u nephrology 

## 2019-09-17 NOTE — Assessment & Plan Note (Signed)
09/16/19 wbc 6.2, Hgb 9.3(9s 02/2019), plt 268, TSH 1.93, MCV97.9(80-100), MCH 33.1(27-33), repeat CBC, obtain Fe, FeSat, Vit B12, Folate, Ferritin, TIBC, retic count, FOBT x3.

## 2019-09-17 NOTE — Assessment & Plan Note (Signed)
Continue chronic Coumadin.

## 2019-09-17 NOTE — Assessment & Plan Note (Signed)
Hx of frequent falls.

## 2019-09-17 NOTE — Assessment & Plan Note (Signed)
Improving, continue Torsemide.

## 2019-09-17 NOTE — Progress Notes (Signed)
Location:   SNF [place] Room Number: 8 Place of Service:  SNF (31) Provider: [facility] Ritu Gagliardo NP  Virgie Dad, MD  Patient Care Team: Virgie Dad, MD as PCP - General (Internal Medicine)  Extended Emergency Contact Information Primary Emergency Contact: Marko Stai Mobile Phone: [phone] Relation: Son Secondary Emergency Contact: braxen, dobek Mobile Phone: [phone] Relation: Daughter  Code Status:  DNR Goals of care: Advanced Directive information Advanced Directives 09/17/2019  Does Patient Have a Medical Advance Directive? Yes  Type of Advance Directive [place]  Does patient want to make changes to medical advance directive? No - Patient declined  Copy of [place] in Chart? Yes - validated most recent copy scanned in chart (See row information)  Would patient like information on creating a medical advance directive? -  Pre-existing out of facility DNR order (yellow form or pink MOST form) -     Chief Complaint  Patient presents with   Acute Visit    Anemia    HPI:  Pt is a 86 y.o. male seen today for an acute visit for Hgb dropped to 9.3 09/16/19 from previous 10.8 07/11/19, Hgb was 9s Feb 2021. The patient denied abd pain, nausea, vomiting, indigestion, constipation, diarrhea, blood in stool or urine.   Gout, stable takes Allopurinol              BPH/Urinary frequency, LUTS,  takes Finasteride, Tamsulosin,              HTN takes Furosemide 20mg  qd             Edema PVD, no open wounds, takes Torsemide.              AFib, heart rate is in control             CKD stage 4             Frequent falls in the past             Dementia, Namanda listed as allergy, AR is constipation. Glantamine AR is dizziness.   Anxiety/depressin, takes Sertraline, Quetiapine             Hx of PE, on Coumadin, INR 2.5 09/06/19   Past Medical History:  Diagnosis Date   Aortic atherosclerosis (HCC)    Arthritis    Chronic  kidney disease    DVT (deep venous thrombosis) (HCC)    hx   Gout    Hypertension    Low back pain    Memory deficit    PE (pulmonary embolism)    hx   Shortness of breath    Vitamin D deficiency    Wears glasses    Past Surgical History:  Procedure Laterality Date   BACK SURGERY  1990   lumb lam   CHOLECYSTECTOMY  2008   lap choli with umb HR   COLONOSCOPY     REPAIR EXTENSOR TENDON Right 05/26/2012   Procedure: RECONSTRUCTION EXTENSOR HOOD RIGHT MIDDLE FINGER;  Surgeon: Wynonia Sours, MD;  Location: [place];  Service: Orthopedics;  Laterality: Right;   TENDON REPAIR  1990   left arm   TONSILLECTOMY     UMBILICAL HERNIA REPAIR  2008   UPPER GI ENDOSCOPY     VOCAL CORD LATERALIZATION, ENDOSCOPIC APPROACH W/ MLB     polyp    Allergies  Allergen Reactions   Galantamine     Other reaction(s): Dizziness (intolerance)  Memantine     Other reaction(s): Other (See Comments) CONSTIPATION    Allergies as of 09/17/2019      Reactions   Galantamine    Other reaction(s): Dizziness (intolerance)   Memantine    Other reaction(s): Other (See Comments) CONSTIPATION      Medication List       Accurate as of September 17, 2019 11:59 PM. If you have any questions, ask your nurse or doctor.        allopurinol 100 MG tablet Commonly known as: ZYLOPRIM Take 100 mg by mouth daily.   B COMPLEX PO Take by mouth daily.   finasteride 5 MG tablet Commonly known as: PROSCAR Take 5 mg by mouth daily.   ketoconazole 2 % cream Commonly known as: NIZORAL Apply 1 application topically 2 (two) times daily.   LORazepam 0.5 MG tablet Commonly known as: ATIVAN Take 0.5 mg by mouth every 6 (six) hours as needed for anxiety.   melatonin 3 MG Tabs tablet Take 3 mg by mouth at bedtime.   QUEtiapine 25 MG tablet Commonly known as: SEROQUEL Take 12.5 mg by mouth 2 (two) times daily.   sennosides-docusate sodium 8.6-50 MG tablet Commonly known  as: SENOKOT-S Take 2 tablets by mouth daily.   tamsulosin 0.4 MG Caps capsule Commonly known as: FLOMAX Take 0.4 mg by mouth daily.   torsemide 10 MG tablet Commonly known as: DEMADEX Take 10 mg by mouth daily.   Vitamin D 50 MCG (2000 UT) tablet Take 2,000 Units by mouth daily.   warfarin 3 MG tablet Commonly known as: COUMADIN Take 1.5 mg by mouth daily. Tuesday, Thursday, Saturdays and Sundays   warfarin 3 MG tablet Commonly known as: COUMADIN Take 3 mg by mouth daily. Mondays, Wednesdays and Fridays.   zinc oxide 20 % ointment Apply 1 application topically as needed for irritation.       Review of Systems  Constitutional: Negative for appetite change, fatigue and fever.  HENT: Positive for hearing loss. Negative for congestion and voice change.   Respiratory: Negative for cough and shortness of breath.   Cardiovascular: Positive for leg swelling. Negative for chest pain and palpitations.  Gastrointestinal: Negative for abdominal pain and constipation.  Genitourinary: Positive for frequency. Negative for difficulty urinating, dysuria and hematuria.       Refused Foley in the past  Musculoskeletal: Positive for arthralgias, back pain and gait problem.  Skin: Negative for color change and pallor.  Neurological: Negative for weakness, light-headedness and headaches.  Psychiatric/Behavioral: Positive for agitation, behavioral problems and sleep disturbance. Negative for hallucinations. The patient is nervous/anxious.     Immunization History  Administered Date(s) Administered   PFIZER SARS-COV-2 Vaccination 01/30/2019, 02/20/2019   Tdap 02/22/2019   Pertinent  Health Maintenance Due  Topic Date Due   PNA vac Low Risk Adult (1 of 2 - PCV13) Never done   INFLUENZA VACCINE  Never done   No flowsheet data found. Functional Status Survey:    Vitals:   09/17/19 1403  BP: (!) 158/80  Pulse: 73  Resp: 18  Temp: 98 F (36.7 C)  SpO2: 93%  Weight: 175 lb (79.4  kg)  Height: 5' 8.5" (1.74 m)   Body mass index is 26.22 kg/m. Physical Exam Vitals and nursing note reviewed.  Constitutional:      Appearance: Normal appearance.  HENT:     Head: Normocephalic and atraumatic.     Mouth/Throat:     Mouth: Mucous membranes are moist.  Eyes:  Extraocular Movements: Extraocular movements intact.     Conjunctiva/sclera: Conjunctivae normal.     Pupils: Pupils are equal, round, and reactive to light.  Cardiovascular:     Rate and Rhythm: Normal rate and regular rhythm.     Heart sounds: No murmur heard.   Pulmonary:     Effort: Pulmonary effort is normal.     Breath sounds: No rales.  Abdominal:     General: Bowel sounds are normal.     Palpations: Abdomen is soft.     Tenderness: There is no abdominal tenderness.  Musculoskeletal:     Cervical back: Normal range of motion and neck supple.     Right lower leg: Edema present.     Left lower leg: Edema present.     Comments: 1-2+edema BLE  Skin:    General: Skin is warm and dry.     Findings: No bruising.     Comments: Skin lesions mid chest, long standing, non healing. Right tragus cutaneous horn appearance.   Neurological:     General: No focal deficit present.     Mental Status: He is alert. Mental status is at baseline.     Motor: No weakness.     Coordination: Coordination normal.     Gait: Gait abnormal.     Comments: Oriented to person, place.   Psychiatric:        Mood and Affect: Mood normal.        Behavior: Behavior normal.     Comments: Smiled, conversed appropriately during my examination today.      Labs reviewed: Recent Labs    02/16/19 1116 02/22/19 0641 02/22/19 0657 07/11/19 0450 07/11/19 0511  NA 134*   < > 130* 139 137  K 3.9   < > 5.1 4.0 4.1  CL 102   < > 97* 102 103  CO2 23  --  19*  --  24  GLUCOSE 114*   < > 92 97 102*  BUN 48*   < > 41* 37* 38*  CREATININE 2.85*   < > 2.65* 2.50* 2.27*  CALCIUM 8.8*  --  8.8*  --  9.5   < > = values in this  interval not displayed.   Recent Labs    07/11/19 0511  AST 26  ALT 24  ALKPHOS 64  BILITOT 0.5  PROT 6.8  ALBUMIN 3.4*   Recent Labs    02/16/19 1116 02/22/19 0641 02/22/19 0657 07/11/19 0450 07/11/19 0511  WBC 6.4  --  6.0  --  6.9  NEUTROABS  --   --   --   --  4.4  HGB 10.2*   < > 10.8* 10.9* 10.8*  HCT 31.3*   < > 34.0* 32.0* 34.0*  MCV 102.0*  --  103.0*  --  101.8*  PLT 194  --  173  --  216   < > = values in this interval not displayed.   No results found for: TSH No results found for: HGBA1C No results found for: CHOL, HDL, LDLCALC, LDLDIRECT, TRIG, CHOLHDL  Significant Diagnostic Results in last 30 days:  No results found.  Assessment/Plan Anemia, chronic disease 09/16/19 wbc 6.2, Hgb 9.3(9s 02/2019), plt 268, TSH 1.93, MCV97.9(80-100), MCH 33.1(27-33), repeat CBC, obtain Fe, FeSat, Vit B12, Folate, Ferritin, TIBC, retic count, FOBT x3.    A-fib (HCC) Heart rate is in control.   Edema of both lower extremities due to peripheral venous insufficiency Improving, continue Torsemide.   Vascular dementia (  [place]) Adjusting to SNF [facility].   CKD (chronic kidney disease) stage 4, GFR 15-29 ml/min (HCC) F/u nephrology   Frequent falls Hx of frequent falls.   History of pulmonary embolism Continue chronic Coumadin.   Anxiety and depression Improving, continue Sertraline, Quetiapine.     Family/ staff Communication: plan of care reviewed with the patient and charge nurse.   Labs/tests ordered:  CBC, obtain Fe, FeSat, Vit B12, Folate, Ferritin, TIBC, retic count, FOBT x3.    Time spend 35 minutes.

## 2019-09-17 NOTE — Assessment & Plan Note (Signed)
Heart rate is in control.  

## 2019-09-17 NOTE — Assessment & Plan Note (Signed)
Improving, continue Sertraline, Quetiapine.

## 2019-09-17 NOTE — Assessment & Plan Note (Signed)
Adjusting to SNF [facility].

## 2019-09-21 ENCOUNTER — Encounter: Payer: Self-pay | Admitting: Nurse Practitioner

## 2019-09-23 LAB — IRON,TIBC AND FERRITIN PANEL
%SAT: 14
Ferritin: 26
Iron: 41
TIBC: 302

## 2019-09-23 LAB — CBC: RBC: 2.72 — AB (ref 3.87–5.11)

## 2019-09-23 LAB — CBC AND DIFFERENTIAL
HCT: 27 — AB (ref 41–53)
Hemoglobin: 9.1 — AB (ref 13.5–17.5)
Neutrophils Absolute: 5175
Platelets: 243 (ref 150–399)
WBC: 7.9

## 2019-09-23 LAB — BASIC METABOLIC PANEL
BUN: 43 — AB (ref 4–21)
CO2: 26 — AB (ref 13–22)
Chloride: 103 (ref 99–108)
Creatinine: 2.6 — AB (ref 0.6–1.3)
Glucose: 79
Potassium: 4.2 (ref 3.4–5.3)
Sodium: 138 (ref 137–147)

## 2019-09-23 LAB — VITAMIN B12: Vitamin B-12: 529

## 2019-09-23 LAB — COMPREHENSIVE METABOLIC PANEL: Calcium: 8.9 (ref 8.7–10.7)

## 2019-09-23 LAB — POCT INR: INR: 3.3 — AB (ref 0.9–1.1)

## 2019-09-28 ENCOUNTER — Non-Acute Institutional Stay (SKILLED_NURSING_FACILITY): Payer: Medicare Other | Admitting: Internal Medicine

## 2019-09-28 ENCOUNTER — Encounter: Payer: Self-pay | Admitting: Internal Medicine

## 2019-09-28 DIAGNOSIS — F0151 Vascular dementia with behavioral disturbance: Secondary | ICD-10-CM

## 2019-09-28 DIAGNOSIS — D638 Anemia in other chronic diseases classified elsewhere: Secondary | ICD-10-CM

## 2019-09-28 DIAGNOSIS — Z86711 Personal history of pulmonary embolism: Secondary | ICD-10-CM | POA: Diagnosis not present

## 2019-09-28 DIAGNOSIS — F419 Anxiety disorder, unspecified: Secondary | ICD-10-CM

## 2019-09-28 DIAGNOSIS — F329 Major depressive disorder, single episode, unspecified: Secondary | ICD-10-CM

## 2019-09-28 DIAGNOSIS — F32A Depression, unspecified: Secondary | ICD-10-CM

## 2019-09-28 DIAGNOSIS — N184 Chronic kidney disease, stage 4 (severe): Secondary | ICD-10-CM

## 2019-09-28 DIAGNOSIS — F01518 Vascular dementia, unspecified severity, with other behavioral disturbance: Secondary | ICD-10-CM

## 2019-09-28 NOTE — Progress Notes (Signed)
Location:    [facility] Room Number: 8 Place of Service:  SNF [phone]) Provider:  Veleta Miners MD  Virgie Dad, MD  Patient Care Team: Virgie Dad, MD as PCP - General (Internal Medicine)  Extended Emergency Contact Information Primary Emergency Contact: Marko Stai Mobile Phone: [phone] Relation: Son Secondary Emergency Contact: dory, verdun Mobile Phone: [phone] Relation: Daughter  Code Status:  DNR Goals of care: Advanced Directive information Advanced Directives 09/17/2019  Does Patient Have a Medical Advance Directive? Yes  Type of Advance Directive [place]  Does patient want to make changes to medical advance directive? No - Patient declined  Copy of [place] in Chart? Yes - validated most recent copy scanned in chart (See row information)  Would patient like information on creating a medical advance directive? -  Pre-existing out of facility DNR order (yellow form or pink MOST form) -     Chief Complaint  Patient presents with  . Acute Visit    Behaviors    HPI:  Pt is a 86 y.o. male seen today for an acute visit for to review the use of Seroquel and Ativan PRN  Recently has moved to SNF from home.  Where he was living with his wife.  He has 2 sons who live in [place].  His wife is unable to take care of him because of recurrent falls.  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  Has had number of falls skin tears.  Most of the behaviors are in the evening.  When he gets very upset and tries to leave the facility.  He has done better on low-dose of Seroquel and Zoloft.  Nurses  continues to have to use Ativan as needed sometimes. Patient did not have any acute complaints today.  He had a fall again this morning.  Had a small skin tear.  Past Medical History:  Diagnosis Date  . Aortic atherosclerosis ([place])   . Arthritis   . Chronic kidney disease   . DVT (deep venous thrombosis) (HCC)    hx  . Gout   . Hypertension   . Low back pain   . Memory deficit   . PE (pulmonary embolism)    hx  . Shortness of breath   . Vitamin D deficiency   . Wears glasses    Past Surgical History:  Procedure Laterality Date  . BACK SURGERY  1990   lumb lam  . CHOLECYSTECTOMY  2008   lap choli with umb HR  . COLONOSCOPY    . REPAIR EXTENSOR TENDON Right 05/26/2012   Procedure: RECONSTRUCTION EXTENSOR HOOD RIGHT MIDDLE FINGER;  Surgeon: Wynonia Sours, MD;  Location: [facility];  Service: Orthopedics;  Laterality: Right;  . TENDON REPAIR  1990   left arm  . TONSILLECTOMY    . UMBILICAL HERNIA REPAIR  2008  . UPPER GI ENDOSCOPY    . VOCAL CORD LATERALIZATION, ENDOSCOPIC APPROACH W/ MLB     polyp    Allergies  Allergen Reactions  . Galantamine     Other reaction(s): Dizziness (intolerance)  . Memantine     Other reaction(s): Other (See Comments) CONSTIPATION  Allergies as of 09/28/2019      Reactions   Galantamine    Other reaction(s): Dizziness (intolerance)   Memantine    Other reaction(s): Other (See Comments) CONSTIPATION      Medication List       Accurate as of September 28, 2019 11:16 AM. If you have any questions, ask your nurse or doctor.        allopurinol 100 MG tablet Commonly known as: ZYLOPRIM Take 100 mg by mouth daily.   B COMPLEX PO Take by mouth daily.   finasteride 5 MG tablet Commonly known as: PROSCAR Take 5 mg by mouth daily.   iron polysaccharides 150 MG capsule Commonly known as: NIFEREX Take 150 mg by mouth daily.   ketoconazole 2 % cream Commonly known as: NIZORAL Apply 1 application topically 2  (two) times daily.   LORazepam 0.5 MG tablet Commonly known as: ATIVAN Take 0.5 mg by mouth daily as needed for anxiety.   melatonin 3 MG Tabs tablet Take 3 mg by mouth at bedtime.   QUEtiapine 25 MG tablet Commonly known as: SEROQUEL Take 12.5 mg by mouth 2 (two) times daily.   sennosides-docusate sodium 8.6-50 MG tablet Commonly known as: SENOKOT-S Take 2 tablets by mouth daily.   sertraline 25 MG tablet Commonly known as: ZOLOFT Take 25 mg by mouth daily.   tamsulosin 0.4 MG Caps capsule Commonly known as: FLOMAX Take 0.4 mg by mouth daily.   torsemide 10 MG tablet Commonly known as: DEMADEX Take 10 mg by mouth daily.   Vitamin D 50 MCG (2000 UT) tablet Take 2,000 Units by mouth daily.   warfarin 3 MG tablet Commonly known as: COUMADIN Take 1.5 mg by mouth daily. Tuesday, Thursday, Saturdays and Sundays   warfarin 3 MG tablet Commonly known as: COUMADIN Take 3 mg by mouth daily. Mondays, Wednesdays and Fridays.   zinc oxide 20 % ointment Apply 1 application topically as needed for irritation.       Review of Systems  Unable to perform ROS: Dementia    Immunization History  Administered Date(s) Administered  . PFIZER SARS-COV-2 Vaccination 01/30/2019, 02/20/2019  . Tdap 02/22/2019   Pertinent  Health Maintenance Due  Topic Date Due  . PNA vac Low Risk Adult (1 of 2 - PCV13) Never done  . INFLUENZA VACCINE  Never done   No flowsheet data found. Functional Status Survey:    Vitals:   09/28/19 1111  BP: 119/73  Pulse: 92  Resp: 20  Temp: 97.6 F (36.4 C)  SpO2: 95%  Weight: 172 lb (78 kg)  Height: 5' 8.5" (1.74 m)   Body mass index is 25.77 kg/m. Physical Exam Vitals reviewed.  Constitutional:      Appearance: Normal appearance.  HENT:     Head: Normocephalic.     Nose: Nose normal.     Mouth/Throat:     Mouth: Mucous membranes are moist.     Pharynx: Oropharynx is clear.  Eyes:     Pupils: Pupils are equal, round, and reactive to  light.  Cardiovascular:     Rate and Rhythm: Normal rate.     Pulses: Normal pulses.  Pulmonary:     Effort: Pulmonary effort is normal.  Abdominal:     General: Abdomen is flat. Bowel sounds are normal.     Palpations: Abdomen is soft.  Musculoskeletal:        General: Swelling present.     Cervical back: Neck supple.  Skin:  General: Skin is warm.     Comments: Small Skin tear in right hand  Neurological:     General: No focal deficit present.     Mental Status: He is alert.     Comments: Not oriented. Walks with the walker but is unstable and most of the time forgets to use it and fall  Psychiatric:        Mood and Affect: Mood normal.        Thought Content: Thought content normal.     Labs reviewed: Recent Labs    02/22/19 0657 02/22/19 0657 07/11/19 0450 07/11/19 0511 09/23/19 0000  NA 130*   < > 139 137 138  K 5.1   < > 4.0 4.1 4.2  CL 97*   < > 102 103 103  CO2 19*  --   --  24 26*  GLUCOSE 92  --  97 102*  --   BUN 41*   < > 37* 38* 43*  CREATININE 2.65*   < > 2.50* 2.27* 2.6*  CALCIUM 8.8*  --   --  9.5 8.9   < > = values in this interval not displayed.   Recent Labs    07/11/19 0511  AST 26  ALT 24  ALKPHOS 64  BILITOT 0.5  PROT 6.8  ALBUMIN 3.4*   Recent Labs    02/16/19 1116 02/22/19 0641 02/22/19 0657 07/11/19 0450 07/11/19 0511 09/16/19 0000 09/23/19 0000  WBC 6.4  --  6.0  --  6.9 6.2 7.9  NEUTROABS  --   --   --   --  4.4  --  5,175  HGB 10.2*   < > 10.8*   < > 10.8* 9.3* 9.1*  HCT 31.3*   < > 34.0*   < > 34.0* 28* 27*  MCV 102.0*  --  103.0*  --  101.8*  --   --   PLT 194  --  173  --  216 268 243   < > = values in this interval not displayed.   Lab Results  Component Value Date   TSH 1.93 09/16/2019   No results found for: HGBA1C No results found for: CHOL, HDL, LDLCALC, LDLDIRECT, TRIG, CHOLHDL  Significant Diagnostic Results in last 30 days:  No results found.  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  Anemia, chronic disease Most likely due to his CKD Started on iron  Anxiety and depression Increase the Zoloft to 50 mg QD  Other stable issues  CKD (chronic kidney disease) stage 4, GFR 15-29 ml/min (HCC) Some worsening on Torsemide and possible due to decrease intake Has Nephrology Follow up schedule in Dec Edema of both lower extremities due to peripheral venous insufficiency Much Better on Torsemide Has lost 5 lbs BPH with obstruction/lower urinary tract symptoms Has refused Foley as recommended by urology for urinary retention Managed with Flomax and Proscar Hypertension, unspecified type Treating conservatively due to his history of orthostasis  History of pulmonary embolism Continue on Coumadin Gout,  Doing well on allopurinol  frequent falls History of postural hypotension and poor awareness Continue therapy  Family/ staff Communication:   Labs/tests ordered:

## 2019-09-28 NOTE — Progress Notes (Signed)
This encounter was created in error - please disregard.

## 2019-10-07 LAB — CBC AND DIFFERENTIAL
HCT: 31 — AB (ref 41–53)
Hemoglobin: 10.2 — AB (ref 13.5–17.5)
Platelets: 297 (ref 150–399)
WBC: 9.3

## 2019-10-07 LAB — CBC: RBC: 3.12 — AB (ref 3.87–5.11)

## 2019-10-12 ENCOUNTER — Encounter: Payer: Self-pay | Admitting: Internal Medicine

## 2019-10-12 ENCOUNTER — Non-Acute Institutional Stay (SKILLED_NURSING_FACILITY): Payer: Medicare Other | Admitting: Internal Medicine

## 2019-10-12 DIAGNOSIS — Z86711 Personal history of pulmonary embolism: Secondary | ICD-10-CM

## 2019-10-12 DIAGNOSIS — F01518 Vascular dementia, unspecified severity, with other behavioral disturbance: Secondary | ICD-10-CM

## 2019-10-12 DIAGNOSIS — F0151 Vascular dementia with behavioral disturbance: Secondary | ICD-10-CM

## 2019-10-12 DIAGNOSIS — R296 Repeated falls: Secondary | ICD-10-CM | POA: Diagnosis not present

## 2019-10-12 DIAGNOSIS — N184 Chronic kidney disease, stage 4 (severe): Secondary | ICD-10-CM | POA: Diagnosis not present

## 2019-10-12 DIAGNOSIS — F419 Anxiety disorder, unspecified: Secondary | ICD-10-CM | POA: Diagnosis not present

## 2019-10-12 DIAGNOSIS — F329 Major depressive disorder, single episode, unspecified: Secondary | ICD-10-CM

## 2019-10-12 DIAGNOSIS — D638 Anemia in other chronic diseases classified elsewhere: Secondary | ICD-10-CM | POA: Diagnosis not present

## 2019-10-12 NOTE — Progress Notes (Signed)
Location:    [facility] Room Number: 8 Place of Service:  SNF [phone]) Provider:  Veleta Miners MD  Virgie Dad, MD  Patient Care Team: Virgie Dad, MD as PCP - General (Internal Medicine)  Extended Emergency Contact Information Primary Emergency Contact: Marko Stai Mobile Phone: [phone] Relation: Son Secondary Emergency Contact: jeris, roser Mobile Phone: [phone] Relation: Daughter  Code Status:  DNR Goals of care: Advanced Directive information Advanced Directives 09/17/2019  Does Patient Have a Medical Advance Directive? Yes  Type of Advance Directive [place]  Does patient want to make changes to medical advance directive? No - Patient declined  Copy of [place] in Chart? Yes - validated most recent copy scanned in chart (See row information)  Would patient like information on creating a medical advance directive? -  Pre-existing out of facility DNR order (yellow form or pink MOST form) -     Chief Complaint  Patient presents with  . Acute Visit    Falls    HPI:  Pt is a 86 y.o. male seen today for an acute visit for Falls and Behaviors  Recently has moved to SNF from home.Where he was living with his wife. He has 2 sons who live in [place]. His wife is unable to take care of him because of recurrent falls. 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 Fall documented every other day. Mostly when he tries to stand up without Assist. Forgets to call for help and use his walker No Dizziness.  Also Continues to have issues with Behaviors including Lashing out at staff and Nurses.And his wife. Including Paranoia    Past Medical History:  Diagnosis Date  .  Aortic atherosclerosis ([place])   . Arthritis   . Chronic kidney disease   . DVT (deep venous thrombosis) (HCC)    hx  . Gout   . Hypertension   . Low back pain   . Memory deficit   . PE (pulmonary embolism)    hx  . Shortness of breath   . Vitamin D deficiency   . Wears glasses    Past Surgical History:  Procedure Laterality Date  . BACK SURGERY  1990   lumb lam  . CHOLECYSTECTOMY  2008   lap choli with umb HR  . COLONOSCOPY    . REPAIR EXTENSOR TENDON Right 05/26/2012   Procedure: RECONSTRUCTION EXTENSOR HOOD RIGHT MIDDLE FINGER;  Surgeon: Wynonia Sours, MD;  Location: [facility];  Service: Orthopedics;  Laterality: Right;  . TENDON REPAIR  1990   left arm  . TONSILLECTOMY    . UMBILICAL HERNIA REPAIR  2008  . UPPER GI ENDOSCOPY    . VOCAL CORD LATERALIZATION, ENDOSCOPIC APPROACH W/ MLB     polyp    Allergies  Allergen Reactions  . Galantamine     Other reaction(s): Dizziness (intolerance)  . Memantine     Other reaction(s): Other (See Comments) CONSTIPATION    Allergies as of 10/12/2019      Reactions   Galantamine    Other reaction(s): Dizziness (intolerance)   Memantine    Other reaction(s): Other (See Comments) CONSTIPATION      Medication List       Accurate as of October 12, 2019  10:44 AM. If you have any questions, ask your nurse or doctor.        STOP taking these medications   ketoconazole 2 % cream Commonly known as: NIZORAL Stopped by: Virgie Dad, MD     TAKE these medications   allopurinol 100 MG tablet Commonly known as: ZYLOPRIM Take 100 mg by mouth daily.   B COMPLEX PO Take by mouth daily.   finasteride 5 MG tablet Commonly known as: PROSCAR Take 5 mg by mouth daily.   iron polysaccharides 150 MG capsule Commonly known as: NIFEREX Take 150 mg by mouth daily.   LORazepam 0.5 MG tablet Commonly known as: ATIVAN Take 0.5 mg by mouth every 8 (eight) hours.   melatonin 3 MG Tabs tablet Take 3 mg by mouth  at bedtime.   nystatin cream Commonly known as: MYCOSTATIN Apply 1 application topically 2 (two) times daily.   QUEtiapine 25 MG tablet Commonly known as: SEROQUEL Take 12.5 mg by mouth 2 (two) times daily.   sennosides-docusate sodium 8.6-50 MG tablet Commonly known as: SENOKOT-S Take 2 tablets by mouth daily.   sertraline 50 MG tablet Commonly known as: ZOLOFT Take 50 mg by mouth daily.   tamsulosin 0.4 MG Caps capsule Commonly known as: FLOMAX Take 0.4 mg by mouth daily.   torsemide 10 MG tablet Commonly known as: DEMADEX Take 10 mg by mouth daily.   Vitamin D 50 MCG (2000 UT) tablet Take 2,000 Units by mouth daily.   warfarin 3 MG tablet Commonly known as: COUMADIN Take 1.5 mg by mouth daily. Monday, Tuesday, Thursday, Friday, Saturdays and Sundays   warfarin 3 MG tablet Commonly known as: COUMADIN Take 3 mg by mouth daily. Wednesdays   zinc oxide 20 % ointment Apply 1 application topically as needed for irritation.       Review of Systems  Unable to perform ROS: Dementia    Immunization History  Administered Date(s) Administered  . PFIZER SARS-COV-2 Vaccination 01/30/2019, 02/20/2019  . Tdap 02/22/2019   Pertinent  Health Maintenance Due  Topic Date Due  . PNA vac Low Risk Adult (1 of 2 - PCV13) Never done  . INFLUENZA VACCINE  Never done   No flowsheet data found. Functional Status Survey:    Vitals:   10/12/19 1010  BP: (!) 156/77  Pulse: 73  Resp: 12  Temp: (!) 97.2 F (36.2 C)  SpO2: 97%  Weight: 171 lb (77.6 kg)  Height: 5' 8.5" (1.74 m)   Body mass index is 25.62 kg/m. Physical Exam Vitals reviewed.  Constitutional:      Appearance: Normal appearance.  HENT:     Head: Normocephalic.     Nose: Nose normal.     Mouth/Throat:     Mouth: Mucous membranes are moist.     Pharynx: Oropharynx is clear.  Eyes:     Pupils: Pupils are equal, round, and reactive to light.  Cardiovascular:     Rate and Rhythm: Normal rate and  regular rhythm.     Pulses: Normal pulses.  Pulmonary:     Effort: Pulmonary effort is normal.     Breath sounds: Normal breath sounds.  Abdominal:     General: Abdomen is flat. Bowel sounds are normal.     Palpations: Abdomen is soft.  Musculoskeletal:        General: Swelling present.     Cervical back: Neck supple.  Skin:    General: Skin is warm.  Neurological:     General: No  focal deficit present.     Mental Status: He is alert.     Comments: Not Oriented  Psychiatric:        Mood and Affect: Mood normal.        Thought Content: Thought content normal.     Labs reviewed: Recent Labs    02/22/19 0657 02/22/19 0657 07/11/19 0450 07/11/19 0511 09/23/19 0000  NA 130*   < > 139 137 138  K 5.1   < > 4.0 4.1 4.2  CL 97*   < > 102 103 103  CO2 19*  --   --  24 26*  GLUCOSE 92  --  97 102*  --   BUN 41*   < > 37* 38* 43*  CREATININE 2.65*   < > 2.50* 2.27* 2.6*  CALCIUM 8.8*  --   --  9.5 8.9   < > = values in this interval not displayed.   Recent Labs    07/11/19 0511  AST 26  ALT 24  ALKPHOS 64  BILITOT 0.5  PROT 6.8  ALBUMIN 3.4*   Recent Labs    02/16/19 1116 02/22/19 0641 02/22/19 0657 07/11/19 0450 07/11/19 0511 07/11/19 0511 09/16/19 0000 09/23/19 0000 10/07/19 0000  WBC 6.4  --  6.0  --  6.9  --  6.2 7.9 9.3  NEUTROABS  --   --   --   --  4.4  --   --  5,175  --   HGB 10.2*   < > 10.8*   < > 10.8*   < > 9.3* 9.1* 10.2*  HCT 31.3*   < > 34.0*   < > 34.0*   < > 28* 27* 31*  MCV 102.0*  --  103.0*  --  101.8*  --   --   --   --   PLT 194  --  173  --  216   < > 268 243 297   < > = values in this interval not displayed.   Lab Results  Component Value Date   TSH 1.93 09/16/2019   No results found for: HGBA1C No results found for: CHOL, HDL, LDLCALC, LDLDIRECT, TRIG, CHOLHDL  Significant Diagnostic Results in last 30 days:  No results found.  Assessment/Plan  Frequent falls Continues to be issue. Combination of Cognition and Unstable  Gait Forgets to use his walker  CKD (chronic kidney disease) stage 4, GFR 15-29 ml/min (HCC) Creat stable on Demadex Anemia, chronic disease Continue on Iron Anxiety and depression Doing better on Low dose of Seroquel and Zoloft Discontinue PRN ativan Vascular dementia with behavior disturbance (HCC) Supportive care And Seroquel History of pulmonary embolism Continue on Coumadin If continues to fall consider Discontinuing Coumadin    Family/ staff Communication:   Labs/tests ordered:

## 2019-10-14 ENCOUNTER — Non-Acute Institutional Stay (SKILLED_NURSING_FACILITY): Payer: Medicare Other | Admitting: Internal Medicine

## 2019-10-14 ENCOUNTER — Encounter: Payer: Self-pay | Admitting: Internal Medicine

## 2019-10-14 DIAGNOSIS — F419 Anxiety disorder, unspecified: Secondary | ICD-10-CM | POA: Diagnosis not present

## 2019-10-14 DIAGNOSIS — N184 Chronic kidney disease, stage 4 (severe): Secondary | ICD-10-CM | POA: Diagnosis not present

## 2019-10-14 DIAGNOSIS — Z86711 Personal history of pulmonary embolism: Secondary | ICD-10-CM | POA: Diagnosis not present

## 2019-10-14 DIAGNOSIS — F32A Depression, unspecified: Secondary | ICD-10-CM

## 2019-10-14 DIAGNOSIS — F329 Major depressive disorder, single episode, unspecified: Secondary | ICD-10-CM

## 2019-10-14 DIAGNOSIS — F01518 Vascular dementia, unspecified severity, with other behavioral disturbance: Secondary | ICD-10-CM

## 2019-10-14 DIAGNOSIS — R296 Repeated falls: Secondary | ICD-10-CM | POA: Diagnosis not present

## 2019-10-14 DIAGNOSIS — F0151 Vascular dementia with behavioral disturbance: Secondary | ICD-10-CM

## 2019-10-14 NOTE — Progress Notes (Signed)
Location:   [place] Room Number: 8 Place of Service:  SNF [phone]) Provider:  Veleta Miners, MD     Patient Care Team: Virgie Dad, MD as PCP - General (Internal Medicine)  Extended Emergency Contact Information Primary Emergency Contact: Marko Stai Mobile Phone: [phone] Relation: Son Secondary Emergency Contact: jahrel, borthwick Mobile Phone: [phone] Relation: Daughter  Code Status:  DNR Goals of care: Advanced Directive information Advanced Directives 10/15/2019  Does Patient Have a Medical Advance Directive? Yes  Type of Paramedic of [place];Living will;Out of facility DNR (pink MOST or yellow form)  Does patient want to make changes to medical advance directive? No - Patient declined  Copy of [place] in Chart? Yes - validated most recent copy scanned in chart (See row information)  Would patient like information on creating a medical advance directive? -  Pre-existing out of facility DNR order (yellow form or pink MOST form) Yellow form placed in chart (order not valid for inpatient use)     Chief Complaint  Patient presents with  . Acute Visit    Fall.    HPI:  Pt is a 86 y.o. male seen today for an acute visit for Fall with Head injury Talk to Family about stopping Coumadin  Recentlyhasmovedto SNF from home.Where he was living with his wife. He has 2 sons who live in [place]. His wife is unable to take care of him because of recurrent falls. 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 Fall documented Every other day Also has behavior Issues. With Agitation towards his wife and Staff Yesterday fell and hit his head. Has small hematoma. No other Neuro  changes    Past Medical History:  Diagnosis Date  . Aortic atherosclerosis ([place])   . Arthritis   . Chronic kidney disease   . DVT (deep venous thrombosis) (HCC)    hx  . Gout   . Hypertension   . Low back pain   . Memory deficit   . PE (pulmonary embolism)    hx  . Shortness of breath   . Vitamin D deficiency   . Wears glasses    Past Surgical History:  Procedure Laterality Date  . BACK SURGERY  1990   lumb lam  . CHOLECYSTECTOMY  2008   lap choli with umb HR  . COLONOSCOPY    . REPAIR EXTENSOR TENDON Right 05/26/2012   Procedure: RECONSTRUCTION EXTENSOR HOOD RIGHT MIDDLE FINGER;  Surgeon: Wynonia Sours, MD;  Location: [facility];  Service: Orthopedics;  Laterality: Right;  . TENDON REPAIR  1990   left arm  . TONSILLECTOMY    . UMBILICAL HERNIA REPAIR  2008  . UPPER GI ENDOSCOPY    . VOCAL CORD LATERALIZATION, ENDOSCOPIC APPROACH W/ MLB     polyp    Allergies  Allergen Reactions  . Galantamine     Other reaction(s): Dizziness (intolerance)  . Memantine     Other reaction(s): Other (See Comments) CONSTIPATION    Allergies as of 10/14/2019      Reactions   Galantamine    Other reaction(s): Dizziness (intolerance)   Memantine    Other reaction(s): Other (See Comments) CONSTIPATION      Medication List       Accurate  as of October 14, 2019 11:59 PM. If you have any questions, ask your nurse or doctor.        allopurinol 100 MG tablet Commonly known as: ZYLOPRIM Take 100 mg by mouth daily.   B COMPLEX PO Take by mouth daily.   finasteride 5 MG tablet Commonly known as: PROSCAR Take 5 mg by mouth daily.   iron polysaccharides 150 MG capsule Commonly known as: NIFEREX Take 150 mg by mouth daily.   LORazepam 0.5 MG tablet Commonly known as: ATIVAN Take 0.5 mg by mouth every 8 (eight) hours.   melatonin 3 MG Tabs tablet Take 3 mg by mouth at bedtime.   nystatin cream Commonly known as: MYCOSTATIN Apply 1 application  topically 2 (two) times daily.   QUEtiapine 25 MG tablet Commonly known as: SEROQUEL Take 12.5 mg by mouth 2 (two) times daily.   sennosides-docusate sodium 8.6-50 MG tablet Commonly known as: SENOKOT-S Take 2 tablets by mouth daily.   sertraline 50 MG tablet Commonly known as: ZOLOFT Take 50 mg by mouth daily.   tamsulosin 0.4 MG Caps capsule Commonly known as: FLOMAX Take 0.4 mg by mouth daily.   torsemide 10 MG tablet Commonly known as: DEMADEX Take 10 mg by mouth daily.   Vitamin D 50 MCG (2000 UT) tablet Take 2,000 Units by mouth daily.   warfarin 3 MG tablet Commonly known as: COUMADIN Take 1.5 mg by mouth daily. Monday, Tuesday, Thursday, Friday, Saturdays and Sundays   warfarin 3 MG tablet Commonly known as: COUMADIN Take 3 mg by mouth daily. Wednesdays   zinc oxide 20 % ointment Apply 1 application topically as needed for irritation.       Review of Systems  Unable to perform ROS: Dementia    Immunization History  Administered Date(s) Administered  . PFIZER SARS-COV-2 Vaccination 01/30/2019, 02/20/2019  . Tdap 02/22/2019   Pertinent  Health Maintenance Due  Topic Date Due  . PNA vac Low Risk Adult (1 of 2 - PCV13) Never done  . INFLUENZA VACCINE  Never done   No flowsheet data found. Functional Status Survey:    Vitals:   10/14/19 1508  BP: 130/73  Pulse: 83  Resp: 12  Temp: 98.1 F (36.7 C)  SpO2: 96%  Weight: 171 lb (77.6 kg)  Height: 5' 8.5" (1.74 m)   Body mass index is 25.62 kg/m. Physical Exam Vitals reviewed.  Constitutional:      Appearance: Normal appearance.  HENT:     Head: Normocephalic.     Comments: Has small Hematoma on back of his head    Nose: Nose normal.     Mouth/Throat:     Mouth: Mucous membranes are moist.     Pharynx: Oropharynx is clear.  Eyes:     Pupils: Pupils are equal, round, and reactive to light.  Cardiovascular:     Rate and Rhythm: Normal rate and regular rhythm.     Pulses: Normal pulses.    Pulmonary:     Effort: Pulmonary effort is normal.  Abdominal:     General: Abdomen is flat. Bowel sounds are normal.     Palpations: Abdomen is soft.  Musculoskeletal:     Cervical back: Neck supple.     Comments: Moderate swelling Bilateral  Skin:    General: Skin is warm.  Neurological:     General: No focal deficit present.     Mental Status: He is alert.     Comments: Stand up with the walker. But unsteady.  No Dizziness  Psychiatric:        Mood and Affect: Mood normal.        Thought Content: Thought content normal.     Labs reviewed: Recent Labs    02/22/19 0657 02/22/19 0657 07/11/19 0450 07/11/19 0511 09/23/19 0000  NA 130*   < > 139 137 138  K 5.1   < > 4.0 4.1 4.2  CL 97*   < > 102 103 103  CO2 19*  --   --  24 26*  GLUCOSE 92  --  97 102*  --   BUN 41*   < > 37* 38* 43*  CREATININE 2.65*   < > 2.50* 2.27* 2.6*  CALCIUM 8.8*  --   --  9.5 8.9   < > = values in this interval not displayed.   Recent Labs    07/11/19 0511  AST 26  ALT 24  ALKPHOS 64  BILITOT 0.5  PROT 6.8  ALBUMIN 3.4*   Recent Labs    02/16/19 1116 02/22/19 0641 02/22/19 0657 07/11/19 0450 07/11/19 0511 07/11/19 0511 09/16/19 0000 09/23/19 0000 10/07/19 0000  WBC 6.4  --  6.0  --  6.9  --  6.2 7.9 9.3  NEUTROABS  --   --   --   --  4.4  --   --  5,175  --   HGB 10.2*   < > 10.8*   < > 10.8*   < > 9.3* 9.1* 10.2*  HCT 31.3*   < > 34.0*   < > 34.0*   < > 28* 27* 31*  MCV 102.0*  --  103.0*  --  101.8*  --   --   --   --   PLT 194  --  173  --  216   < > 268 243 297   < > = values in this interval not displayed.   Lab Results  Component Value Date   TSH 1.93 09/16/2019   No results found for: HGBA1C No results found for: CHOL, HDL, LDLCALC, LDLDIRECT, TRIG, CHOLHDL  Significant Diagnostic Results in last 30 days:  No results found.  Assessment/Plan Frequent falls Continues to be issue Discussed with the Son and Wife Have taken him off Ativan On Very low dose of  Seroquel and Zoloft He continues to try to walk without the Assist/Walker Staff is doing frequent checks on him  History of pulmonary embolism Has been on Coumadin for many years D/W son about his risk of head injury with Bleed Will discontinue Coumadin for 2 weeks. Family will let me know if they want it restarted  If he continues to fall we would not be able to restart it.  CKD (chronic kidney disease) stage 4, GFR 15-29 ml/min (HCC) Creat stable On Low dose of Demadex Cannot increase the dose due to his Postural Hypotension Anxiety and depression Doing better on Zoloft and Seroquel Ativan Discontinued Vascular and Senile dementia with behavior disturbance ([place]) Supportive care Has not tolerated Namenda before   Family/ staff Communication:   Labs/tests ordered:

## 2019-10-15 ENCOUNTER — Encounter: Payer: Self-pay | Admitting: Nurse Practitioner

## 2019-10-15 ENCOUNTER — Non-Acute Institutional Stay (SKILLED_NURSING_FACILITY): Payer: Medicare Other | Admitting: Nurse Practitioner

## 2019-10-15 DIAGNOSIS — I872 Venous insufficiency (chronic) (peripheral): Secondary | ICD-10-CM | POA: Diagnosis not present

## 2019-10-15 DIAGNOSIS — D638 Anemia in other chronic diseases classified elsewhere: Secondary | ICD-10-CM

## 2019-10-15 DIAGNOSIS — I10 Essential (primary) hypertension: Secondary | ICD-10-CM | POA: Diagnosis not present

## 2019-10-15 DIAGNOSIS — N184 Chronic kidney disease, stage 4 (severe): Secondary | ICD-10-CM

## 2019-10-15 DIAGNOSIS — F32A Depression, unspecified: Secondary | ICD-10-CM

## 2019-10-15 DIAGNOSIS — F01518 Vascular dementia, unspecified severity, with other behavioral disturbance: Secondary | ICD-10-CM

## 2019-10-15 DIAGNOSIS — F0151 Vascular dementia with behavioral disturbance: Secondary | ICD-10-CM | POA: Diagnosis not present

## 2019-10-15 DIAGNOSIS — K5901 Slow transit constipation: Secondary | ICD-10-CM

## 2019-10-15 DIAGNOSIS — F419 Anxiety disorder, unspecified: Secondary | ICD-10-CM

## 2019-10-15 DIAGNOSIS — N138 Other obstructive and reflux uropathy: Secondary | ICD-10-CM

## 2019-10-15 DIAGNOSIS — I4891 Unspecified atrial fibrillation: Secondary | ICD-10-CM

## 2019-10-15 DIAGNOSIS — R296 Repeated falls: Secondary | ICD-10-CM

## 2019-10-15 DIAGNOSIS — N401 Enlarged prostate with lower urinary tract symptoms: Secondary | ICD-10-CM

## 2019-10-15 DIAGNOSIS — M109 Gout, unspecified: Secondary | ICD-10-CM

## 2019-10-15 NOTE — Assessment & Plan Note (Signed)
Frequent falls in the past, last fall 10/14/19, resulted in skin tears left wrist, right forearm. Lack of safety awareness and increased frailty are contributory, continue close supervision/assistance for safety.

## 2019-10-15 NOTE — Assessment & Plan Note (Signed)
BPH/Urinary frequency, LUTS, takes Finasteride, Tamsulosin,

## 2019-10-15 NOTE — Assessment & Plan Note (Signed)
At his baseline, Hgb 10.2 10/07/19, taking Iron

## 2019-10-15 NOTE — Assessment & Plan Note (Signed)
Continue SNF [facility] for supportive care, episodes of emotional outburst due to lack of understanding his current living arrangement and surroundings.

## 2019-10-15 NOTE — Assessment & Plan Note (Signed)
Stable, continue Allopurinol.  

## 2019-10-15 NOTE — Assessment & Plan Note (Signed)
Blood pressure is controlled

## 2019-10-15 NOTE — Assessment & Plan Note (Signed)
Stable, continue Senokot S

## 2019-10-15 NOTE — Assessment & Plan Note (Signed)
heart rate is in control, Hx of PE

## 2019-10-15 NOTE — Assessment & Plan Note (Signed)
Stable 4, creat 2s

## 2019-10-15 NOTE — Assessment & Plan Note (Signed)
PVD, no open wounds, takes Torsemide.

## 2019-10-15 NOTE — Progress Notes (Signed)
Location:   [place] Room Number: 8 Place of Service:  SNF (31) Provider:  Marlana Latus, NP  Virgie Dad, MD  Patient Care Team: Virgie Dad, MD as PCP - General (Internal Medicine)  Extended Emergency Contact Information Primary Emergency Contact: Marko Stai Mobile Phone: [phone] Relation: Son Secondary Emergency Contact: andra, matsuo Mobile Phone: [phone] Relation: Daughter  Code Status:  DNR Goals of care: Advanced Directive information Advanced Directives 10/15/2019  Does Patient Have a Medical Advance Directive? Yes  Type of Paramedic of [place];Living will;Out of facility DNR (pink MOST or yellow form)  Does patient want to make changes to medical advance directive? No - Patient declined  Copy of [place] in Chart? Yes - validated most recent copy scanned in chart (See row information)  Would patient like information on creating a medical advance directive? -  Pre-existing out of facility DNR order (yellow form or pink MOST form) Yellow form placed in chart (order not valid for inpatient use)     Chief Complaint  Patient presents with   Medical Management of Chronic Issues    Routine follow up visit.   Best Practice Recommendations    Pneumonia vaccine, Flu vaccine    HPI:  Pt is a 86 y.o. male seen today for medical management of chronic diseases.      Anemia, Hgb 10.2 10/07/19 at baseline, takes Fe             Gout, stable takes Allopurinol  BPH/Urinary frequency, LUTS, takes Finasteride, Tamsulosin,  HTN takes Furosemide 20mg  qd Edema PVD, no open wounds, takes Torsemide.  AFib, heart rate is in control CKD stage 4 Frequent falls in the past, last fall 10/14/19, resulted in skin tears left wrist, right forearm.  Dementia, Namanda listed as allergy, AR is constipation. Glantamine AR is  dizziness.              Anxiety/depressin, takes Sertraline, Quetiapine Hx of PE.   Constipation, takes Senokot S,   Past Medical History:  Diagnosis Date   Aortic atherosclerosis (HCC)    Arthritis    Chronic kidney disease    DVT (deep venous thrombosis) (HCC)    hx   Gout    Hypertension    Low back pain    Memory deficit    PE (pulmonary embolism)    hx   Shortness of breath    Vitamin D deficiency    Wears glasses    Past Surgical History:  Procedure Laterality Date   BACK SURGERY  1990   lumb lam   CHOLECYSTECTOMY  2008   lap choli with umb HR   COLONOSCOPY     REPAIR EXTENSOR TENDON Right 05/26/2012   Procedure: RECONSTRUCTION EXTENSOR HOOD RIGHT MIDDLE FINGER;  Surgeon: Wynonia Sours, MD;  Location: [facility];  Service: Orthopedics;  Laterality: Right;   TENDON REPAIR  1990   left arm   TONSILLECTOMY     UMBILICAL HERNIA REPAIR  2008   UPPER GI ENDOSCOPY     VOCAL CORD LATERALIZATION, ENDOSCOPIC APPROACH W/ MLB     polyp    Allergies  Allergen Reactions   Galantamine     Other reaction(s): Dizziness (intolerance)   Memantine     Other reaction(s): Other (See Comments) CONSTIPATION    Allergies as of 10/15/2019      Reactions   Galantamine    Other reaction(s): Dizziness (intolerance)   Memantine  Other reaction(s): Other (See Comments) CONSTIPATION      Medication List       Accurate as of October 15, 2019 11:59 PM. If you have any questions, ask your nurse or doctor.        STOP taking these medications   LORazepam 0.5 MG tablet Commonly known as: ATIVAN Stopped by: Cari Burgo X Jahziah Simonin, NP   warfarin 3 MG tablet Commonly known as: COUMADIN Stopped by: Anika Shore X Samarrah Tranchina, NP     TAKE these medications   allopurinol 100 MG tablet Commonly known as: ZYLOPRIM Take 100 mg by mouth daily.   B COMPLEX PO Take by mouth daily.   finasteride 5 MG tablet Commonly known as: PROSCAR Take 5 mg by mouth  daily.   iron polysaccharides 150 MG capsule Commonly known as: NIFEREX Take 150 mg by mouth daily.   melatonin 3 MG Tabs tablet Take 3 mg by mouth at bedtime.   nystatin cream Commonly known as: MYCOSTATIN Apply 1 application topically 2 (two) times daily.   QUEtiapine 25 MG tablet Commonly known as: SEROQUEL Take 12.5 mg by mouth 2 (two) times daily.   sennosides-docusate sodium 8.6-50 MG tablet Commonly known as: SENOKOT-S Take 2 tablets by mouth daily.   sertraline 50 MG tablet Commonly known as: ZOLOFT Take 50 mg by mouth daily.   tamsulosin 0.4 MG Caps capsule Commonly known as: FLOMAX Take 0.4 mg by mouth daily.   torsemide 10 MG tablet Commonly known as: DEMADEX Take 10 mg by mouth daily.   Vitamin D 50 MCG (2000 UT) tablet Take 2,000 Units by mouth daily.   zinc oxide 20 % ointment Apply 1 application topically as needed for irritation.       Review of Systems  Constitutional: Negative for appetite change, fatigue, fever and unexpected weight change.  HENT: Positive for hearing loss. Negative for congestion and voice change.   Respiratory: Negative for cough and shortness of breath.   Cardiovascular: Positive for leg swelling. Negative for chest pain and palpitations.  Gastrointestinal: Negative for abdominal pain and constipation.  Genitourinary: Positive for frequency. Negative for difficulty urinating, dysuria and hematuria.       Refused Foley in the past  Musculoskeletal: Positive for arthralgias, back pain and gait problem.  Skin: Positive for wound. Negative for color change.  Neurological: Negative for weakness, light-headedness and headaches.  Psychiatric/Behavioral: Positive for agitation, behavioral problems and sleep disturbance. Negative for hallucinations. The patient is nervous/anxious.     Immunization History  Administered Date(s) Administered   PFIZER SARS-COV-2 Vaccination 01/30/2019, 02/20/2019   Tdap 02/22/2019   Pertinent   Health Maintenance Due  Topic Date Due   PNA vac Low Risk Adult (1 of 2 - PCV13) Never done   INFLUENZA VACCINE  Never done   No flowsheet data found. Functional Status Survey:    Vitals:   10/15/19 1017  BP: 139/73  Pulse: 70  Resp: 20  Temp: 98.2 F (36.8 C)  SpO2: 94%  Weight: 171 lb (77.6 kg)  Height: 5\' 8"  (1.727 m)   Body mass index is 26 kg/m. Physical Exam Vitals and nursing note reviewed.  Constitutional:      Appearance: Normal appearance.  HENT:     Head: Normocephalic and atraumatic.     Mouth/Throat:     Mouth: Mucous membranes are moist.  Eyes:     Extraocular Movements: Extraocular movements intact.     Conjunctiva/sclera: Conjunctivae normal.     Pupils: Pupils are equal, round, and  reactive to light.  Cardiovascular:     Rate and Rhythm: Normal rate and regular rhythm.     Heart sounds: No murmur heard.   Pulmonary:     Effort: Pulmonary effort is normal.     Breath sounds: No rales.  Abdominal:     General: Bowel sounds are normal.     Palpations: Abdomen is soft.     Tenderness: There is no abdominal tenderness.  Musculoskeletal:     Cervical back: Normal range of motion and neck supple.     Right lower leg: Edema present.     Left lower leg: Edema present.     Comments: 1-2+edema BLE  Skin:    General: Skin is warm and dry.     Findings: Bruising present.     Comments: No active bleeding or s/s of infection of the skin tears left wrist x2, right arm skin tear. Multiple ecchymoses BLE  Neurological:     General: No focal deficit present.     Mental Status: He is alert. Mental status is at baseline.     Motor: No weakness.     Coordination: Coordination normal.     Gait: Gait abnormal.     Comments: Oriented to person, place.   Psychiatric:        Mood and Affect: Mood normal.        Behavior: Behavior normal.     Comments: Smiled, conversed appropriately during my examination today.      Labs reviewed: Recent Labs     02/22/19 0657 02/22/19 0657 07/11/19 0450 07/11/19 0511 09/23/19 0000  NA 130*   < > 139 137 138  K 5.1   < > 4.0 4.1 4.2  CL 97*   < > 102 103 103  CO2 19*  --   --  24 26*  GLUCOSE 92  --  97 102*  --   BUN 41*   < > 37* 38* 43*  CREATININE 2.65*   < > 2.50* 2.27* 2.6*  CALCIUM 8.8*  --   --  9.5 8.9   < > = values in this interval not displayed.   Recent Labs    07/11/19 0511  AST 26  ALT 24  ALKPHOS 64  BILITOT 0.5  PROT 6.8  ALBUMIN 3.4*   Recent Labs    02/16/19 1116 02/22/19 0641 02/22/19 0657 07/11/19 0450 07/11/19 0511 07/11/19 0511 09/16/19 0000 09/23/19 0000 10/07/19 0000  WBC 6.4  --  6.0  --  6.9  --  6.2 7.9 9.3  NEUTROABS  --   --   --   --  4.4  --   --  5,175  --   HGB 10.2*   < > 10.8*   < > 10.8*   < > 9.3* 9.1* 10.2*  HCT 31.3*   < > 34.0*   < > 34.0*   < > 28* 27* 31*  MCV 102.0*  --  103.0*  --  101.8*  --   --   --   --   PLT 194  --  173  --  216   < > 268 243 297   < > = values in this interval not displayed.   Lab Results  Component Value Date   TSH 1.93 09/16/2019   No results found for: HGBA1C No results found for: CHOL, HDL, LDLCALC, LDLDIRECT, TRIG, CHOLHDL  Significant Diagnostic Results in last 30 days:  No results found.  Assessment/Plan A-fib ([place])  heart rate is in control, Hx of PE   Edema of both lower extremities due to peripheral venous insufficiency PVD, no open wounds, takes Torsemide.   HTN (hypertension) Blood pressure is controlled.   Vascular dementia ([place]) Continue SNF [facility] for supportive care, episodes of emotional outburst due to lack of understanding his current living arrangement and surroundings.   BPH with obstruction/lower urinary tract symptoms BPH/Urinary frequency, LUTS, takes Finasteride, Tamsulosin,    CKD (chronic kidney disease) stage 4, GFR 15-29 ml/min (HCC) Stable 4, creat 2s  Anemia, chronic disease At his baseline, Hgb 10.2 10/07/19, taking Iron  Anxiety and  depression Episodes of emotional outburst related to advanced dementia, continue Sertraline, Quetiapine  Frequent falls Frequent falls in the past, last fall 10/14/19, resulted in skin tears left wrist, right forearm. Lack of safety awareness and increased frailty are contributory, continue close supervision/assistance for safety.    Gout Stable, continue Allopurinol.   Slow transit constipation Stable, continue Senokot S     Family/ staff Communication: plan of care reviewed with the patient and charge nurse.   Labs/tests ordered:  none  Time spend 25 minutes.

## 2019-10-15 NOTE — Assessment & Plan Note (Signed)
Episodes of emotional outburst related to advanced dementia, continue Sertraline, Quetiapine

## 2019-10-18 ENCOUNTER — Encounter: Payer: Self-pay | Admitting: Nurse Practitioner

## 2019-10-25 ENCOUNTER — Non-Acute Institutional Stay (SKILLED_NURSING_FACILITY): Payer: Medicare Other | Admitting: Nurse Practitioner

## 2019-10-25 ENCOUNTER — Encounter: Payer: Self-pay | Admitting: Nurse Practitioner

## 2019-10-25 DIAGNOSIS — K5901 Slow transit constipation: Secondary | ICD-10-CM

## 2019-10-25 DIAGNOSIS — F32A Depression, unspecified: Secondary | ICD-10-CM

## 2019-10-25 DIAGNOSIS — F01518 Vascular dementia, unspecified severity, with other behavioral disturbance: Secondary | ICD-10-CM

## 2019-10-25 DIAGNOSIS — I4891 Unspecified atrial fibrillation: Secondary | ICD-10-CM

## 2019-10-25 DIAGNOSIS — R296 Repeated falls: Secondary | ICD-10-CM | POA: Diagnosis not present

## 2019-10-25 DIAGNOSIS — M109 Gout, unspecified: Secondary | ICD-10-CM | POA: Diagnosis not present

## 2019-10-25 DIAGNOSIS — F0151 Vascular dementia with behavioral disturbance: Secondary | ICD-10-CM

## 2019-10-25 DIAGNOSIS — N184 Chronic kidney disease, stage 4 (severe): Secondary | ICD-10-CM

## 2019-10-25 DIAGNOSIS — F419 Anxiety disorder, unspecified: Secondary | ICD-10-CM | POA: Diagnosis not present

## 2019-10-25 DIAGNOSIS — D638 Anemia in other chronic diseases classified elsewhere: Secondary | ICD-10-CM | POA: Diagnosis not present

## 2019-10-25 DIAGNOSIS — I10 Essential (primary) hypertension: Secondary | ICD-10-CM

## 2019-10-25 DIAGNOSIS — M25561 Pain in right knee: Secondary | ICD-10-CM

## 2019-10-25 DIAGNOSIS — I872 Venous insufficiency (chronic) (peripheral): Secondary | ICD-10-CM

## 2019-10-25 NOTE — Assessment & Plan Note (Signed)
Stage 4. 

## 2019-10-25 NOTE — Assessment & Plan Note (Signed)
Chronic, continue Torsemide.

## 2019-10-25 NOTE — Assessment & Plan Note (Signed)
Anemia, Hgb 10.2 10/07/19 at baseline, takes Fe

## 2019-10-25 NOTE — Assessment & Plan Note (Addendum)
unwitnessed fall 10/23/19 when the patient was found in sitting position in his bathroom, sustained skin tears left shoulder, left forearm, left elbow, RLE, bruise in the  left lower back. The patient appears sleepy upon my visit today, staff reported the patient appears more sleepy than usual. The patient denied pain, headache, chest pain/palpitation, no noted focal weakness. Will decrease Quetiapine to 12.$RemoveBeforeDE'5mg'[id number]$  qd/bid, observe. Update CBC/diff, CMP/eGFR in am

## 2019-10-25 NOTE — Assessment & Plan Note (Signed)
Dementia, Namanda listed as allergy, AR is constipation. Glantamine AR is dizziness.limited safety awareness is contributory to falling, continue close supervision for safety.

## 2019-10-25 NOTE — Assessment & Plan Note (Signed)
Stable, continue Senokot S

## 2019-10-25 NOTE — Assessment & Plan Note (Signed)
Stable, continue Allopurinol.  

## 2019-10-25 NOTE — Progress Notes (Signed)
Location:    [facility] Room Number: 8 Place of Service:  SNF (31) Provider:  Marda Stalker, Lennie Odor NP  Virgie Dad, MD  Patient Care Team: Virgie Dad, MD as PCP - General (Internal Medicine)  Extended Emergency Contact Information Primary Emergency Contact: Marko Stai Mobile Phone: [phone] Relation: Son Secondary Emergency Contact: saylor, sheckler Mobile Phone: [phone] Relation: Daughter  Code Status:  DNR Goals of care: Advanced Directive information Advanced Directives 10/15/2019  Does Patient Have a Medical Advance Directive? Yes  Type of Paramedic of [place];Living will;Out of facility DNR (pink MOST or yellow form)  Does patient want to make changes to medical advance directive? No - Patient declined  Copy of [place] in Chart? Yes - validated most recent copy scanned in chart (See row information)  Would patient like information on creating a medical advance directive? -  Pre-existing out of facility DNR order (yellow form or pink MOST form) Yellow form placed in chart (order not valid for inpatient use)     Chief Complaint  Patient presents with  . Acute Visit    fall, skin tears    HPI:  Pt is a 86 y.o. male seen today for an acute visit for reported unwitnessed fall 10/23/19 when the patient was found in sitting position in his bathroom, sustained skin tears left shoulder, left forearm, left elbow, RLE, bruise in the  left lower back. The patient appears sleepy upon my visit today, staff reported the patient appears more sleepy than usual. The patient denied headache, chest pain/palpitation, no noted focal weakness. The patient c/o right knee pain when touched or with weight bearing, no deformity, bruise, redness, warmth, ballottement noted.   Anemia, Hgb 10.2 10/07/19 at baseline, takes Fe Gout, stable takes Allopurinol  BPH/Urinary frequency, LUTS, takes  Finasteride, Tamsulosin,  HTN takes Furosemide $RemoveBeforeDE'20mg'mEwSZIeChekFRzH$  qd Edema PVD, chronic,  takesTorsemide.  AFib, heart rate is in control CKD stage 4 Frequent falls in the past, related to increased frailty, limited safety awareness Dementia, Namanda listed as allergy, AR is constipation. Glantamine AR is dizziness. Anxiety/depressin, takes Sertraline, Quetiapine             Constipation, takes Senokot S   Past Medical History:  Diagnosis Date  . Aortic atherosclerosis ([place])   . Arthritis   . Chronic kidney disease   . DVT (deep venous thrombosis) (HCC)    hx  . Gout   . Hypertension   . Low back pain   . Memory deficit   . PE (pulmonary embolism)    hx  . Shortness of breath   . Vitamin D deficiency   . Wears glasses    Past Surgical History:  Procedure Laterality Date  . BACK SURGERY  1990   lumb lam  . CHOLECYSTECTOMY  2008   lap choli with umb HR  . COLONOSCOPY    . REPAIR EXTENSOR TENDON Right 05/26/2012   Procedure: RECONSTRUCTION EXTENSOR HOOD RIGHT MIDDLE FINGER;  Surgeon: Wynonia Sours, MD;  Location: [facility];  Service: Orthopedics;  Laterality: Right;  . TENDON REPAIR  1990   left arm  . TONSILLECTOMY    . UMBILICAL HERNIA REPAIR  2008  . UPPER GI ENDOSCOPY    . VOCAL CORD LATERALIZATION, ENDOSCOPIC APPROACH W/ MLB     polyp    Allergies  Allergen Reactions  . Galantamine     Other reaction(s): Dizziness (intolerance)  . Memantine  Other reaction(s): Other (See Comments) CONSTIPATION    Allergies as of 10/25/2019      Reactions   Galantamine    Other reaction(s): Dizziness (intolerance)   Memantine    Other reaction(s): Other (See Comments) CONSTIPATION      Medication List       Accurate as of October 25, 2019 11:59 PM. If you have any questions, ask your nurse or doctor.        acetaminophen 325 MG tablet Commonly known as: TYLENOL Take 650  mg by mouth every 4 (four) hours as needed.   allopurinol 100 MG tablet Commonly known as: ZYLOPRIM Take 100 mg by mouth daily.   B COMPLEX PO Take by mouth daily.   finasteride 5 MG tablet Commonly known as: PROSCAR Take 5 mg by mouth daily.   iron polysaccharides 150 MG capsule Commonly known as: NIFEREX Take 150 mg by mouth daily.   melatonin 3 MG Tabs tablet Take 3 mg by mouth at bedtime.   mineral oil-hydrophilic petrolatum ointment Apply topically daily.   nystatin cream Commonly known as: MYCOSTATIN Apply 1 application topically 2 (two) times daily.   QUEtiapine 25 MG tablet Commonly known as: SEROQUEL Take 12.5 mg by mouth 2 (two) times daily.   sennosides-docusate sodium 8.6-50 MG tablet Commonly known as: SENOKOT-S Take 2 tablets by mouth daily.   sertraline 50 MG tablet Commonly known as: ZOLOFT Take 50 mg by mouth daily.   tamsulosin 0.4 MG Caps capsule Commonly known as: FLOMAX Take 0.4 mg by mouth daily.   torsemide 10 MG tablet Commonly known as: DEMADEX Take 10 mg by mouth daily.   Vitamin D 50 MCG (2000 UT) tablet Take 2,000 Units by mouth daily.   zinc oxide 20 % ointment Apply 1 application topically as needed for irritation.       Review of Systems  Constitutional: Positive for activity change and fatigue. Negative for appetite change and fever.       Appears sleepy  HENT: Positive for hearing loss. Negative for congestion and voice change.   Respiratory: Negative for cough and shortness of breath.   Cardiovascular: Positive for leg swelling. Negative for chest pain and palpitations.  Gastrointestinal: Negative for abdominal pain and constipation.  Genitourinary: Positive for frequency. Negative for difficulty urinating, dysuria and hematuria.       Refused Foley in the past  Musculoskeletal: Positive for arthralgias, back pain and gait problem.       R knee pain  Skin: Positive for wound. Negative for color change.       Skin  tears.   Neurological: Negative for dizziness, facial asymmetry, speech difficulty, weakness and headaches.  Psychiatric/Behavioral: Positive for agitation, behavioral problems and sleep disturbance. Negative for hallucinations. The patient is nervous/anxious.        His mood is stabilizing.     Immunization History  Administered Date(s) Administered  . PFIZER SARS-COV-2 Vaccination 01/30/2019, 02/20/2019  . Tdap 02/22/2019   Pertinent  Health Maintenance Due  Topic Date Due  . PNA vac Low Risk Adult (1 of 2 - PCV13) Never done  . INFLUENZA VACCINE  Never done   No flowsheet data found. Functional Status Survey:    Vitals:   10/25/19 1151  BP: (!) 142/71  Pulse: 92  Resp: 20  Temp: 98.2 F (36.8 C)  SpO2: 97%  Weight: 178 lb (80.7 kg)  Height: 5' 8.5" (1.74 m)   Body mass index is 26.67 kg/m. Physical Exam Vitals and nursing  note reviewed.  Constitutional:      Appearance: Normal appearance.  HENT:     Head: Normocephalic and atraumatic.  Eyes:     Extraocular Movements: Extraocular movements intact.     Conjunctiva/sclera: Conjunctivae normal.     Pupils: Pupils are equal, round, and reactive to light.  Cardiovascular:     Rate and Rhythm: Normal rate and regular rhythm.     Heart sounds: No murmur heard.   Pulmonary:     Effort: Pulmonary effort is normal.     Breath sounds: No rales.  Abdominal:     General: Bowel sounds are normal.     Palpations: Abdomen is soft.     Tenderness: There is no abdominal tenderness.  Musculoskeletal:        General: Tenderness present. No swelling or deformity.     Cervical back: Normal range of motion and neck supple.     Right lower leg: Edema present.     Left lower leg: Edema present.     Comments: 1+ edema BLE. skin tears left shoulder, left forearm, left elbow. Bruise left lower back. Ecchymoses BLE. R knee, upper Tib/fib pain palpated, no apparent bruise, redness, swelling, ballottement, or deformity noted.    Skin:    General: Skin is warm and dry.     Findings: Bruising present.     Comments: No active bleeding or s/s of infection of the skin tears left wrist x2, right arm skin tear. Multiple ecchymoses BLE  Neurological:     General: No focal deficit present.     Mental Status: He is alert. Mental status is at baseline.     Motor: No weakness.     Coordination: Coordination normal.     Gait: Gait abnormal.     Comments: Oriented to person, place.   Psychiatric:        Mood and Affect: Mood normal.        Behavior: Behavior normal.     Comments: Smiled, conversed appropriately during my examination today.      Labs reviewed: Recent Labs    02/22/19 0657 02/22/19 0657 07/11/19 0450 07/11/19 0511 09/23/19 0000  NA 130*   < > 139 137 138  K 5.1   < > 4.0 4.1 4.2  CL 97*   < > 102 103 103  CO2 19*  --   --  24 26*  GLUCOSE 92  --  97 102*  --   BUN 41*   < > 37* 38* 43*  CREATININE 2.65*   < > 2.50* 2.27* 2.6*  CALCIUM 8.8*  --   --  9.5 8.9   < > = values in this interval not displayed.   Recent Labs    07/11/19 0511  AST 26  ALT 24  ALKPHOS 64  BILITOT 0.5  PROT 6.8  ALBUMIN 3.4*   Recent Labs    02/16/19 1116 02/22/19 0641 02/22/19 0657 07/11/19 0450 07/11/19 0511 07/11/19 0511 09/16/19 0000 09/23/19 0000 10/07/19 0000  WBC 6.4  --  6.0  --  6.9  --  6.2 7.9 9.3  NEUTROABS  --   --   --   --  4.4  --   --  5,175  --   HGB 10.2*   < > 10.8*   < > 10.8*   < > 9.3* 9.1* 10.2*  HCT 31.3*   < > 34.0*   < > 34.0*   < > 28* 27* 31*  MCV 102.0*  --  103.0*  --  101.8*  --   --   --   --   PLT 194  --  173  --  216   < > 268 243 297   < > = values in this interval not displayed.   Lab Results  Component Value Date   TSH 1.93 09/16/2019   No results found for: HGBA1C No results found for: CHOL, HDL, LDLCALC, LDLDIRECT, TRIG, CHOLHDL  Significant Diagnostic Results in last 30 days:  No results found.  Assessment/Plan Frequent falls unwitnessed fall  10/23/19 when the patient was found in sitting position in his bathroom, sustained skin tears left shoulder, left forearm, left elbow, RLE, bruise in the  left lower back. The patient appears sleepy upon my visit today, staff reported the patient appears more sleepy than usual. The patient denied pain, headache, chest pain/palpitation, no noted focal weakness. Will decrease Quetiapine to 12.$RemoveBeforeDE'5mg'PsHERLVEBQzNReh$  qd/bid, observe. Update CBC/diff, CMP/eGFR in am  Anemia, chronic disease Anemia, Hgb 10.2 10/07/19 at baseline, takes Fe   Anxiety and depression His mood has been stabilizing, will decrease Quetiapine, continue Sertraline.   Gout Stable, continue Allopurinol.   CKD (chronic kidney disease) stage 4, GFR 15-29 ml/min (HCC) Stage 4  Slow transit constipation Stable, continue Senokot S  Vascular dementia ([place]) Dementia, Namanda listed as allergy, AR is constipation. Glantamine AR is dizziness.limited safety awareness is contributory to falling, continue close supervision for safety.    A-fib (HCC) Heart rate is in control.   Edema of both lower extremities due to peripheral venous insufficiency Chronic, continue Torsemide.   HTN (hypertension) Blood pressure is in control.   Right knee pain The patient c/o right knee pain, upper RLE pain  when touched or with weight bearing, no deformity, bruise, redness, warmth, ballottement noted. Will obtain X-ray R knee/tib/fib 3 views. Will schedule Tylenol $RemoveBeforeDE'650mg'VoFyUtpmOUsSrCw$  tid x 1 week.      Family/ staff Communication: plan of care reviewed with the patient and charge nurse.   Labs/tests ordered:  CBC/diff, CMP/eGFR, X-ray R knee/tib/fib 3 views.   Time spend 35 minutes.

## 2019-10-25 NOTE — Assessment & Plan Note (Signed)
His mood has been stabilizing, will decrease Quetiapine, continue Sertraline.

## 2019-10-25 NOTE — Assessment & Plan Note (Signed)
Heart rate is in control.  

## 2019-10-25 NOTE — Assessment & Plan Note (Signed)
Blood pressure is in control.

## 2019-10-26 DIAGNOSIS — M25561 Pain in right knee: Secondary | ICD-10-CM | POA: Insufficient documentation

## 2019-10-26 NOTE — Assessment & Plan Note (Signed)
The patient c/o right knee pain, upper RLE pain  when touched or with weight bearing, no deformity, bruise, redness, warmth, ballottement noted. Will obtain X-ray R knee/tib/fib 3 views. Will schedule Tylenol 650mg  tid x 1 week.

## 2019-10-27 LAB — BASIC METABOLIC PANEL
BUN: 73 — AB (ref 4–21)
CO2: 20 (ref 13–22)
Chloride: 108 (ref 99–108)
Creatinine: 3 — AB (ref 0.6–1.3)
Glucose: 86
Potassium: 4.2 (ref 3.4–5.3)
Sodium: 137 (ref 137–147)

## 2019-10-27 LAB — HEPATIC FUNCTION PANEL
ALT: 23 (ref 10–40)
AST: 20 (ref 14–40)
Alkaline Phosphatase: 76 (ref 25–125)
Bilirubin, Total: 0.4

## 2019-10-27 LAB — COMPREHENSIVE METABOLIC PANEL
Albumin: 3 — AB (ref 3.5–5.0)
Calcium: 8.4 — AB (ref 8.7–10.7)
GFR calc Af Amer: 21
GFR calc non Af Amer: 18
Globulin: 2.4

## 2019-10-27 LAB — CBC AND DIFFERENTIAL
HCT: 24 — AB (ref 41–53)
Hemoglobin: 8 — AB (ref 13.5–17.5)
Neutrophils Absolute: 5133
Platelets: 211 (ref 150–399)
WBC: 7.1

## 2019-10-27 LAB — CBC: RBC: 2.48 — AB (ref 3.87–5.11)

## 2019-10-28 ENCOUNTER — Encounter: Payer: Self-pay | Admitting: Internal Medicine

## 2019-10-28 ENCOUNTER — Non-Acute Institutional Stay (SKILLED_NURSING_FACILITY): Payer: Medicare Other | Admitting: Internal Medicine

## 2019-10-28 DIAGNOSIS — R296 Repeated falls: Secondary | ICD-10-CM

## 2019-10-28 DIAGNOSIS — R600 Localized edema: Secondary | ICD-10-CM | POA: Diagnosis not present

## 2019-10-28 DIAGNOSIS — N179 Acute kidney failure, unspecified: Secondary | ICD-10-CM | POA: Diagnosis not present

## 2019-10-28 DIAGNOSIS — F32A Depression, unspecified: Secondary | ICD-10-CM

## 2019-10-28 DIAGNOSIS — F419 Anxiety disorder, unspecified: Secondary | ICD-10-CM

## 2019-10-28 DIAGNOSIS — M25561 Pain in right knee: Secondary | ICD-10-CM

## 2019-10-28 DIAGNOSIS — N184 Chronic kidney disease, stage 4 (severe): Secondary | ICD-10-CM

## 2019-10-28 DIAGNOSIS — D631 Anemia in chronic kidney disease: Secondary | ICD-10-CM

## 2019-10-28 DIAGNOSIS — F0151 Vascular dementia with behavioral disturbance: Secondary | ICD-10-CM

## 2019-10-28 DIAGNOSIS — F01518 Vascular dementia, unspecified severity, with other behavioral disturbance: Secondary | ICD-10-CM

## 2019-10-28 NOTE — Progress Notes (Signed)
Location:    [facility] Room Number: 8 Place of Service:  SNF [phone]) Provider:  Veleta Miners MD  Virgie Dad, MD  Patient Care Team: Virgie Dad, MD as PCP - General (Internal Medicine)  Extended Emergency Contact Information Primary Emergency Contact: Marko Stai Mobile Phone: [phone] Relation: Son Secondary Emergency Contact: tyreon, frigon Mobile Phone: [phone] Relation: Daughter  Code Status:  DNR Goals of care: Advanced Directive information Advanced Directives 10/15/2019  Does Patient Have a Medical Advance Directive? Yes  Type of Paramedic of [place];Living will;Out of facility DNR (pink MOST or yellow form)  Does patient want to make changes to medical advance directive? No - Patient declined  Copy of [place] in Chart? Yes - validated most recent copy scanned in chart (See row information)  Would patient like information on creating a medical advance directive? -  Pre-existing out of facility DNR order (yellow form or pink MOST form) Yellow form placed in chart (order not valid for inpatient use)     Chief Complaint  Patient presents with  . Acute Visit    Anemia and falls    HPI:  Pt is a 86 y.o. male seen today for an acute visit for Anemia,Falls And Abdominal Discomfort  Patient has h/o Stage 4 CKD secondary to Hypertension, with Left renal Atrophy, BPH with Urinary retention,  Recurrent PE and DVT Off Chronic Coumadin due to [place] Recurrent Falls Progressive dementia with Behavior Issues, LE edema, Gout, h/o Orthostatic  Labs done today showed BUN and Creat at 74/2.6 up from 49/2.6 Also Worsening LE Edema His Hgb is also low at 8 He is already on Iron.  No Obvious signs of Bleeding Per Nurses patient has become more weak since his last fall He has been needing more assist to get up from the bed.  Also has been complaining of pain in his right knee since his  fall.  The x-ray done in the facility just showed arthritis and osteoporosis with no fractures.  Patient states his pain is slightly better today. Denies any shortness of breath.  Continues to have issues with his behaviors. Past Medical History:  Diagnosis Date  . Aortic atherosclerosis ([place])   . Arthritis   . Chronic kidney disease   . DVT (deep venous thrombosis) (HCC)    hx  . Gout   . Hypertension   . Low back pain   . Memory deficit   . PE (pulmonary embolism)    hx  . Shortness of breath   . Vitamin D deficiency   . Wears glasses    Past Surgical History:  Procedure Laterality Date  . BACK SURGERY  1990   lumb lam  . CHOLECYSTECTOMY  2008   lap choli with umb HR  . COLONOSCOPY    . REPAIR EXTENSOR TENDON Right 05/26/2012   Procedure: RECONSTRUCTION EXTENSOR HOOD RIGHT MIDDLE FINGER;  Surgeon: Wynonia Sours, MD;  Location: [facility];  Service: Orthopedics;  Laterality: Right;  . TENDON REPAIR  1990   left arm  . TONSILLECTOMY    . UMBILICAL HERNIA REPAIR  2008  . UPPER GI ENDOSCOPY    . VOCAL CORD LATERALIZATION, ENDOSCOPIC APPROACH W/ MLB     polyp    Allergies  Allergen Reactions  . Galantamine     Other reaction(s): Dizziness (intolerance)  . Memantine     Other reaction(s): Other (See Comments) CONSTIPATION    Allergies  as of 10/28/2019      Reactions   Galantamine    Other reaction(s): Dizziness (intolerance)   Memantine    Other reaction(s): Other (See Comments) CONSTIPATION      Medication List       Accurate as of October 28, 2019 11:02 AM. If you have any questions, ask your nurse or doctor.        acetaminophen 325 MG tablet Commonly known as: TYLENOL Take 650 mg by mouth in the morning, at noon, and at bedtime.   allopurinol 100 MG tablet Commonly known as: ZYLOPRIM Take 100 mg by mouth daily.   B COMPLEX PO Take by mouth daily.   finasteride 5 MG tablet Commonly known as: PROSCAR Take 5 mg by mouth daily.     iron polysaccharides 150 MG capsule Commonly known as: NIFEREX Take 150 mg by mouth daily.   melatonin 3 MG Tabs tablet Take 3 mg by mouth at bedtime.   mineral oil-hydrophilic petrolatum ointment Apply topically daily.   nystatin cream Commonly known as: MYCOSTATIN Apply 1 application topically 2 (two) times daily.   QUEtiapine 25 MG tablet Commonly known as: SEROQUEL Take 12.5 mg by mouth daily.   sennosides-docusate sodium 8.6-50 MG tablet Commonly known as: SENOKOT-S Take 2 tablets by mouth daily.   sertraline 50 MG tablet Commonly known as: ZOLOFT Take 50 mg by mouth daily.   tamsulosin 0.4 MG Caps capsule Commonly known as: FLOMAX Take 0.4 mg by mouth daily.   torsemide 10 MG tablet Commonly known as: DEMADEX Take 10 mg by mouth daily.   Vitamin D 50 MCG (2000 UT) tablet Take 2,000 Units by mouth daily.   zinc oxide 20 % ointment Apply 1 application topically as needed for irritation.       Review of Systems  Unable to perform ROS: Dementia    Immunization History  Administered Date(s) Administered  . PFIZER SARS-COV-2 Vaccination 01/30/2019, 02/20/2019  . Tdap 02/22/2019   Pertinent  Health Maintenance Due  Topic Date Due  . PNA vac Low Risk Adult (1 of 2 - PCV13) Never done  . INFLUENZA VACCINE  Never done   No flowsheet data found. Functional Status Survey:    Vitals:   10/28/19 1057  BP: 138/75  Pulse: 64  Resp: 20  Temp: 98.3 F (36.8 C)  SpO2: 95%  Weight: 178 lb (80.7 kg)  Height: (!) 8' 5.5" (2.578 m)   Body mass index is 12.15 kg/m. Physical Exam Vitals reviewed.  Constitutional:      Appearance: Normal appearance.  HENT:     Head: Normocephalic.     Nose: Nose normal.     Mouth/Throat:     Mouth: Mucous membranes are moist.     Pharynx: Oropharynx is clear.  Eyes:     Pupils: Pupils are equal, round, and reactive to light.  Cardiovascular:     Rate and Rhythm: Normal rate and regular rhythm.     Pulses: Normal  pulses.     Heart sounds: No murmur heard.   Pulmonary:     Effort: Pulmonary effort is normal. No respiratory distress.     Breath sounds: Normal breath sounds. No wheezing or rales.  Abdominal:     General: There is distension.     Palpations: Abdomen is soft.     Comments: C/o Pain and Discomfort in Lower abdomen  Musculoskeletal:     Cervical back: Neck supple.     Comments: Bilateral Moderate Swelling and draining from  both Legs  Skin:    General: Skin is warm.     Comments: Has number of Bruises and skin tears in his arms and Legs  Neurological:     General: No focal deficit present.     Mental Status: He is alert.  Psychiatric:        Mood and Affect: Mood normal.        Thought Content: Thought content normal.     Labs reviewed: Recent Labs    02/22/19 0657 02/22/19 0657 07/11/19 0450 07/11/19 0511 09/23/19 0000  NA 130*   < > 139 137 138  K 5.1   < > 4.0 4.1 4.2  CL 97*   < > 102 103 103  CO2 19*  --   --  24 26*  GLUCOSE 92  --  97 102*  --   BUN 41*   < > 37* 38* 43*  CREATININE 2.65*   < > 2.50* 2.27* 2.6*  CALCIUM 8.8*  --   --  9.5 8.9   < > = values in this interval not displayed.   Recent Labs    07/11/19 0511  AST 26  ALT 24  ALKPHOS 64  BILITOT 0.5  PROT 6.8  ALBUMIN 3.4*   Recent Labs    02/16/19 1116 02/22/19 0641 02/22/19 0657 07/11/19 0450 07/11/19 0511 07/11/19 0511 09/16/19 0000 09/23/19 0000 10/07/19 0000  WBC 6.4  --  6.0  --  6.9  --  6.2 7.9 9.3  NEUTROABS  --   --   --   --  4.4  --   --  5,175  --   HGB 10.2*   < > 10.8*   < > 10.8*   < > 9.3* 9.1* 10.2*  HCT 31.3*   < > 34.0*   < > 34.0*   < > 28* 27* 31*  MCV 102.0*  --  103.0*  --  101.8*  --   --   --   --   PLT 194  --  173  --  216   < > 268 243 297   < > = values in this interval not displayed.   Lab Results  Component Value Date   TSH 1.93 09/16/2019   No results found for: HGBA1C No results found for: CHOL, HDL, LDLCALC, LDLDIRECT, TRIG,  CHOLHDL  Significant Diagnostic Results in last 30 days:  No results found.  Assessment/Plan Acute renal failure superimposed on stage 4 chronic kidney disease, unspecified acute renal failure type (HCC) BUN  73 and Creat 2.96 Worsening  Also Worsenign of his LE edema Will Increase his Demadex to 20 mg Cannot increase much due to his h/o Dizziness and Postural Hypotension  Anemia due to stage 4 chronic kidney disease (HCC) Hgb 8.0 Will try to check stool for Occult blood Continue Iron Repeat CBC Bilateral leg edema On Low dose of Demadex Will increase it to 20 mg Continue Kerlix Wrap Worried about worsenign Renal Fucntion and Dizziness Frequent falls Getting worse with his Weakness Has not worked with therapy Right Knee pain Exam is benign Xray was negative Will continue to Follow  ? Abdominal Pain Xray of abdomen to rule out Obstruction or Constipation  Vacular and Senile dementia Doing better looks more awake Continue 12.5 mg of Seroquel QD Anxiety and depression On Zoloft Gout On Allopurinol; ACP Discussed in detail with son.  Explained to him that he has now worsening dementia ,renal function ,lower extremity edema and  anemia renal function They do not want any aggressive treatments for him. Do dnot want Palliative either.  Will hold off Hospice for now D/W social worker to discuss MOST form with family  Family/ staff Communication:   Labs/tests ordered:

## 2019-10-29 ENCOUNTER — Non-Acute Institutional Stay (SKILLED_NURSING_FACILITY): Payer: Medicare Other | Admitting: Nurse Practitioner

## 2019-10-29 DIAGNOSIS — D638 Anemia in other chronic diseases classified elsewhere: Secondary | ICD-10-CM | POA: Diagnosis not present

## 2019-10-29 DIAGNOSIS — F01518 Vascular dementia, unspecified severity, with other behavioral disturbance: Secondary | ICD-10-CM

## 2019-10-29 DIAGNOSIS — N184 Chronic kidney disease, stage 4 (severe): Secondary | ICD-10-CM

## 2019-10-29 DIAGNOSIS — F32A Depression, unspecified: Secondary | ICD-10-CM

## 2019-10-29 DIAGNOSIS — F419 Anxiety disorder, unspecified: Secondary | ICD-10-CM

## 2019-10-29 DIAGNOSIS — N138 Other obstructive and reflux uropathy: Secondary | ICD-10-CM

## 2019-10-29 DIAGNOSIS — K560 Paralytic ileus: Secondary | ICD-10-CM | POA: Diagnosis not present

## 2019-10-29 DIAGNOSIS — I4891 Unspecified atrial fibrillation: Secondary | ICD-10-CM

## 2019-10-29 DIAGNOSIS — N401 Enlarged prostate with lower urinary tract symptoms: Secondary | ICD-10-CM | POA: Diagnosis not present

## 2019-10-29 DIAGNOSIS — F0151 Vascular dementia with behavioral disturbance: Secondary | ICD-10-CM

## 2019-10-29 DIAGNOSIS — M109 Gout, unspecified: Secondary | ICD-10-CM | POA: Diagnosis not present

## 2019-10-29 DIAGNOSIS — K5901 Slow transit constipation: Secondary | ICD-10-CM

## 2019-10-29 DIAGNOSIS — I872 Venous insufficiency (chronic) (peripheral): Secondary | ICD-10-CM

## 2019-10-29 DIAGNOSIS — I10 Essential (primary) hypertension: Secondary | ICD-10-CM

## 2019-10-29 NOTE — Progress Notes (Signed)
Location:   SNF [place] Room Number: 8 Place of Service:  SNF (31) Provider: [facility] Brentyn Seehafer NP  Virgie Dad, MD  Patient Care Team: Virgie Dad, MD as PCP - General (Internal Medicine)  Extended Emergency Contact Information Primary Emergency Contact: Marko Stai Mobile Phone: [phone] Relation: Son Secondary Emergency Contact: majour, frei Mobile Phone: [phone] Relation: Daughter  Code Status:  DNR Goals of care: Advanced Directive information Advanced Directives 10/29/2019  Does Patient Have a Medical Advance Directive? Yes  Type of Paramedic of [place];Living will;Out of facility DNR (pink MOST or yellow form)  Does patient want to make changes to medical advance directive? No - Patient declined  Copy of [place] in Chart? Yes - validated most recent copy scanned in chart (See row information)  Would patient like information on creating a medical advance directive? -  Pre-existing out of facility DNR order (yellow form or pink MOST form) Yellow form placed in chart (order not valid for inpatient use)     Chief Complaint  Patient presents with  . Acute Visit    Urinary retention    HPI:  Pt is a 86 y.o. male seen today for an acute visit for urinary retention. 10/28/19 abd X ray adynamic ileus with constipation, hazy pelvis consider distended urinary bladder, self removed Foley. Reinserted Foley Cath with >1500cc urine output, the patient agreed to leave the Foley Cath in for now.   Anemia, Hgb 10.2 9/23/2, then dropped to 8, pending FOBT , takes Fe Gout, stable takes Allopurinol  BPH/Urinary frequency/urinary retention,  LUTS, takes Finasteride, Tamsulosin,  HTN takes Torsemide Edema/PVD, chronic,  takesTorsemide AFib, heart rate is in control CKD stage 4 Frequent falls in the past, related to increased  frailty, limited safety awareness Dementia, Namanda listed as allergy, AR is constipation. Glantamine AR is dizziness. Anxiety/depressin, takes Sertraline, Quetiapine Constipation, takes Senokot S  Past Medical History:  Diagnosis Date  . Aortic atherosclerosis ([place])   . Arthritis   . Chronic kidney disease   . DVT (deep venous thrombosis) (HCC)    hx  . Gout   . Hypertension   . Low back pain   . Memory deficit   . PE (pulmonary embolism)    hx  . Shortness of breath   . Vitamin D deficiency   . Wears glasses    Past Surgical History:  Procedure Laterality Date  . BACK SURGERY  1990   lumb lam  . CHOLECYSTECTOMY  2008   lap choli with umb HR  . COLONOSCOPY    . REPAIR EXTENSOR TENDON Right 05/26/2012   Procedure: RECONSTRUCTION EXTENSOR HOOD RIGHT MIDDLE FINGER;  Surgeon: Wynonia Sours, MD;  Location: [place];  Service: Orthopedics;  Laterality: Right;  . TENDON REPAIR  1990   left arm  . TONSILLECTOMY    . UMBILICAL HERNIA REPAIR  2008  . UPPER GI ENDOSCOPY    . VOCAL CORD LATERALIZATION, ENDOSCOPIC APPROACH W/ MLB     polyp    Allergies  Allergen Reactions  . Galantamine     Other reaction(s): Dizziness (intolerance)  . Memantine     Other reaction(s): Other (See Comments) CONSTIPATION    Allergies as of 10/29/2019      Reactions   Galantamine    Other reaction(s): Dizziness (intolerance)   Memantine    Other reaction(s): Other (See Comments) CONSTIPATION      Medication List  Accurate as of October 29, 2019 11:59 PM. If you have any questions, ask your nurse or doctor.        acetaminophen 325 MG tablet Commonly known as: TYLENOL Take 650 mg by mouth in the morning, at noon, and at bedtime.   allopurinol 100 MG tablet Commonly known as: ZYLOPRIM Take 100 mg by mouth daily.   B COMPLEX PO Take by mouth daily.   finasteride 5 MG tablet Commonly known as: PROSCAR Take 5 mg by mouth  daily.   iron polysaccharides 150 MG capsule Commonly known as: NIFEREX Take 150 mg by mouth daily.   melatonin 3 MG Tabs tablet Take 3 mg by mouth at bedtime.   mineral oil-hydrophilic petrolatum ointment Apply topically daily.   nystatin cream Commonly known as: MYCOSTATIN Apply 1 application topically 2 (two) times daily.   QUEtiapine 25 MG tablet Commonly known as: SEROQUEL Take 12.5 mg by mouth daily.   sennosides-docusate sodium 8.6-50 MG tablet Commonly known as: SENOKOT-S Take 2 tablets by mouth daily.   sertraline 50 MG tablet Commonly known as: ZOLOFT Take 50 mg by mouth daily.   tamsulosin 0.4 MG Caps capsule Commonly known as: FLOMAX Take 0.4 mg by mouth daily.   torsemide 10 MG tablet Commonly known as: DEMADEX Take 10 mg by mouth daily.   Vitamin D 50 MCG (2000 UT) tablet Take 2,000 Units by mouth daily.   zinc oxide 20 % ointment Apply 1 application topically as needed for irritation.       Review of Systems  Constitutional: Negative for appetite change, fatigue and fever.  HENT: Positive for hearing loss. Negative for congestion and voice change.   Respiratory: Negative for cough and shortness of breath.   Cardiovascular: Positive for leg swelling. Negative for chest pain and palpitations.  Gastrointestinal: Negative for abdominal pain and constipation.  Genitourinary: Positive for difficulty urinating. Negative for dysuria, frequency and hematuria.       Refused Foley in the past. Foley in now  Musculoskeletal: Positive for arthralgias, back pain and gait problem.       R knee pain  Skin: Positive for wound. Negative for color change.       Skin tears.   Neurological: Negative for speech difficulty, weakness, light-headedness and headaches.  Psychiatric/Behavioral: Positive for agitation, behavioral problems and sleep disturbance. Negative for hallucinations. The patient is nervous/anxious.        His mood is stabilizing.     Immunization  History  Administered Date(s) Administered  . Influenza-Unspecified 10/19/2019  . PFIZER SARS-COV-2 Vaccination 01/30/2019, 02/20/2019  . Tdap 02/22/2019   Pertinent  Health Maintenance Due  Topic Date Due  . PNA vac Low Risk Adult (1 of 2 - PCV13) Never done  . INFLUENZA VACCINE  Completed   No flowsheet data found. Functional Status Survey:    Vitals:   10/29/19 1510  BP: 138/75  Pulse: 64  Resp: 20  Temp: 98.5 F (36.9 C)  SpO2: 97%  Weight: 178 lb (80.7 kg)  Height: 5' 8.5" (1.74 m)   Body mass index is 26.67 kg/m. Physical Exam Vitals and nursing note reviewed.  Constitutional:      Appearance: Normal appearance.  HENT:     Head: Normocephalic and atraumatic.  Eyes:     Extraocular Movements: Extraocular movements intact.     Conjunctiva/sclera: Conjunctivae normal.     Pupils: Pupils are equal, round, and reactive to light.  Cardiovascular:     Rate and Rhythm: Normal rate and  regular rhythm.     Heart sounds: No murmur heard.   Pulmonary:     Effort: Pulmonary effort is normal.     Breath sounds: No rales.  Abdominal:     General: Bowel sounds are normal.     Palpations: Abdomen is soft.     Tenderness: There is no abdominal tenderness.  Genitourinary:    Comments: Foley Cath Musculoskeletal:        General: No swelling, tenderness or deformity.     Cervical back: Normal range of motion and neck supple.     Right lower leg: Edema present.     Left lower leg: Edema present.     Comments: Improved edema BLE. skin tears left shoulder, left forearm, left elbow. Bruise left lower back. Ecchymoses BLE. R knee, upper Tib/fib pain palpated.   Skin:    General: Skin is warm and dry.     Findings: Bruising present.     Comments: No active bleeding or s/s of infection of the skin tears left wrist x2, right arm skin tear. Multiple ecchymoses BLE  Neurological:     General: No focal deficit present.     Mental Status: He is alert. Mental status is at baseline.      Motor: No weakness.     Coordination: Coordination normal.     Gait: Gait abnormal.     Comments: Oriented to person, place.   Psychiatric:        Mood and Affect: Mood normal.     Comments: Smiled, conversed appropriately during my examination today.      Labs reviewed: Recent Labs    02/22/19 0657 02/22/19 0657 07/11/19 0450 07/11/19 0511 09/23/19 0000  NA 130*   < > 139 137 138  K 5.1   < > 4.0 4.1 4.2  CL 97*   < > 102 103 103  CO2 19*  --   --  24 26*  GLUCOSE 92  --  97 102*  --   BUN 41*   < > 37* 38* 43*  CREATININE 2.65*   < > 2.50* 2.27* 2.6*  CALCIUM 8.8*  --   --  9.5 8.9   < > = values in this interval not displayed.   Recent Labs    07/11/19 0511  AST 26  ALT 24  ALKPHOS 64  BILITOT 0.5  PROT 6.8  ALBUMIN 3.4*   Recent Labs    02/16/19 1116 02/22/19 0641 02/22/19 0657 07/11/19 0450 07/11/19 0511 07/11/19 0511 09/16/19 0000 09/23/19 0000 10/07/19 0000  WBC 6.4  --  6.0  --  6.9  --  6.2 7.9 9.3  NEUTROABS  --   --   --   --  4.4  --   --  5,175  --   HGB 10.2*   < > 10.8*   < > 10.8*   < > 9.3* 9.1* 10.2*  HCT 31.3*   < > 34.0*   < > 34.0*   < > 28* 27* 31*  MCV 102.0*  --  103.0*  --  101.8*  --   --   --   --   PLT 194  --  173  --  216   < > 268 243 297   < > = values in this interval not displayed.   Lab Results  Component Value Date   TSH 1.93 09/16/2019   No results found for: HGBA1C No results found for: CHOL, HDL, LDLCALC, LDLDIRECT, TRIG,  CHOLHDL  Significant Diagnostic Results in last 30 days:  No results found.  Assessment/Plan BPH with obstruction/lower urinary tract symptoms 10/28/19 abd X ray adynamic ileus with constipation, hazy pelvis consider distended urinary bladder, self removed Foley. Reinserted Foley Cath with >1500cc urine out put, the patient agreed to leave the Foley Cath in for now.   BPH/Urinary frequency/urinary retention,  LUTS, takes Finasteride, Tamsulosin,    Adynamic ileus ([place]) Will have  liquid diet for 24 hours, then diet as tolerated. Observe.   Anemia, chronic disease Anemia, Hgb 10.2 9/23/2, then dropped to 8, pending FOBT , continue  Fe    Gout Gout, stable takes Allopurinol    HTN (hypertension) Blood pressure is controlled,  takes Torsemide   Edema of both lower extremities due to peripheral venous insufficiency Edema/PVD, chronic, improved on higher dose Torsemide   A-fib (HCC) Heart rate is in control.   CKD (chronic kidney disease) stage 4, GFR 15-29 ml/min (HCC) Stage 4  Vascular dementia (HCC) Dementia, Namanda listed as allergy, AR is constipation. Glantamine AR is dizziness.   Anxiety and depression Anxiety/depressin, needs Sertraline, Quetiapine   Slow transit constipation Constipation, continue  Senokot S. 10/28/19 abd X ray adynamic ileus with constipation, no noted nausea, vomiting, abd distention/pain presently.      Family/ staff Communication: plan of care reviewed with the patient and charge nurse.   Labs/tests ordered: none  Time spend 35 minutes.

## 2019-10-29 NOTE — Assessment & Plan Note (Signed)
Anxiety/depressin, needs Sertraline, Quetiapine

## 2019-10-29 NOTE — Assessment & Plan Note (Addendum)
Will have liquid diet for 24 hours, then diet as tolerated. Observe.

## 2019-10-29 NOTE — Assessment & Plan Note (Addendum)
Anemia, Hgb 10.2 9/23/2, then dropped to 8, pending FOBT , continue  Fe

## 2019-10-29 NOTE — Assessment & Plan Note (Signed)
Constipation, continue  Senokot S. 10/28/19 abd X ray adynamic ileus with constipation, no noted nausea, vomiting, abd distention/pain presently.

## 2019-10-29 NOTE — Assessment & Plan Note (Signed)
Gout, stable takes Allopurinol

## 2019-10-29 NOTE — Assessment & Plan Note (Signed)
Edema/PVD, chronic, improved on higher dose Torsemide

## 2019-10-29 NOTE — Assessment & Plan Note (Addendum)
10/28/19 abd X ray adynamic ileus with constipation, hazy pelvis consider distended urinary bladder, self removed Foley. Reinserted Foley Cath with >1500cc urine out put, the patient agreed to leave the Foley Cath in for now.   BPH/Urinary frequency/urinary retention,  LUTS, takes Finasteride, Tamsulosin,

## 2019-10-29 NOTE — Assessment & Plan Note (Signed)
Stage 4. 

## 2019-10-29 NOTE — Assessment & Plan Note (Signed)
Blood pressure is controlled,  takes Torsemide

## 2019-10-29 NOTE — Assessment & Plan Note (Signed)
Heart rate is in control.  

## 2019-10-29 NOTE — Assessment & Plan Note (Signed)
Dementia, Namanda listed as allergy, AR is constipation. Glantamine AR is dizziness.

## 2019-11-01 ENCOUNTER — Encounter: Payer: Self-pay | Admitting: Nurse Practitioner

## 2019-11-01 ENCOUNTER — Non-Acute Institutional Stay (SKILLED_NURSING_FACILITY): Payer: Medicare Other | Admitting: Nurse Practitioner

## 2019-11-01 DIAGNOSIS — I10 Essential (primary) hypertension: Secondary | ICD-10-CM

## 2019-11-01 DIAGNOSIS — I872 Venous insufficiency (chronic) (peripheral): Secondary | ICD-10-CM

## 2019-11-01 DIAGNOSIS — K5901 Slow transit constipation: Secondary | ICD-10-CM | POA: Diagnosis not present

## 2019-11-01 DIAGNOSIS — F0151 Vascular dementia with behavioral disturbance: Secondary | ICD-10-CM

## 2019-11-01 DIAGNOSIS — L089 Local infection of the skin and subcutaneous tissue, unspecified: Secondary | ICD-10-CM

## 2019-11-01 DIAGNOSIS — D638 Anemia in other chronic diseases classified elsewhere: Secondary | ICD-10-CM

## 2019-11-01 DIAGNOSIS — I4891 Unspecified atrial fibrillation: Secondary | ICD-10-CM

## 2019-11-01 DIAGNOSIS — F01518 Vascular dementia, unspecified severity, with other behavioral disturbance: Secondary | ICD-10-CM

## 2019-11-01 DIAGNOSIS — K560 Paralytic ileus: Secondary | ICD-10-CM

## 2019-11-01 DIAGNOSIS — N138 Other obstructive and reflux uropathy: Secondary | ICD-10-CM

## 2019-11-01 DIAGNOSIS — R296 Repeated falls: Secondary | ICD-10-CM

## 2019-11-01 DIAGNOSIS — N184 Chronic kidney disease, stage 4 (severe): Secondary | ICD-10-CM

## 2019-11-01 DIAGNOSIS — M109 Gout, unspecified: Secondary | ICD-10-CM

## 2019-11-01 DIAGNOSIS — N401 Enlarged prostate with lower urinary tract symptoms: Secondary | ICD-10-CM

## 2019-11-01 DIAGNOSIS — F419 Anxiety disorder, unspecified: Secondary | ICD-10-CM

## 2019-11-01 DIAGNOSIS — F32A Depression, unspecified: Secondary | ICD-10-CM

## 2019-11-01 NOTE — Assessment & Plan Note (Signed)
BPH/Urinary frequency/urinary retention,  LUTS, takes Finasteride, Tamsulosin, Foley is in place for urinary retention.

## 2019-11-01 NOTE — Progress Notes (Addendum)
Location:   SNF [place] Room Number: 8 Place of Service:  SNF (31) Provider: [facility] Lacee Grey NP  Virgie Dad, MD  Patient Care Team: Virgie Dad, MD as PCP - General (Internal Medicine)  Extended Emergency Contact Information Primary Emergency Contact: Marko Stai Mobile Phone: [phone] Relation: Son Secondary Emergency Contact: dondi, aime Mobile Phone: [phone] Relation: Daughter  Code Status:  DNR Goals of care: Advanced Directive information Advanced Directives 12/02/2019  Does Patient Have a Medical Advance Directive? -  Type of Advance Directive -  Does patient want to make changes to medical advance directive? -  Copy of [place] in Chart? Yes - validated most recent copy scanned in chart (See row information)  Would patient like information on creating a medical advance directive? -  Pre-existing out of facility DNR order (yellow form or pink MOST form) Pink MOST form placed in chart (order not valid for inpatient use)     Chief Complaint  Patient presents with  . Acute Visit    Infected skin tear    HPI:  Pt is a 86 y.o. male seen today for an acute visit for R ear skin lesion, purulent drainage,  redness, warmth noted.                Anemia, Hgb 10.2 9/23/2, then dropped to 8, pending FOBT , takes Fe Gout, stable takes Allopurinol  BPH/Urinary frequency/urinary retention,  LUTS, takes Finasteride, Tamsulosin, Foley is in place for urinary retention.  HTN takes Torsemide Edema/PVD,chronic,takesTorsemide AFib, heart rate is in control CKD stage 4 Frequent falls in the past,related to increased frailty, limited safety awareness Dementia, Namanda listed as allergy, AR is constipation. Glantamine AR is dizziness. Anxiety/depressin, takes Sertraline, Quetiapine Constipation, takes Senokot S,  resolved clinically.     Past Medical History:  Diagnosis Date  . Aortic atherosclerosis ([place])   . Arthritis   . Chronic kidney disease   . DVT (deep venous thrombosis) (H[facility])    hx  . Gout   . Hypertension   . Low back pain   . Memory deficit   . PE (pulmonary embolism)    hx  . Shortness of breath   . Vitamin D deficiency   . Wears glasses    Past Surgical History:  Procedure Laterality Date  . BACK SURGERY  1990   lumb lam  . CHOLECYSTECTOMY  2008   lap choli with umb HR  . COLONOSCOPY    . REPAIR EXTENSOR TENDON Right 05/26/2012   Procedure: RECONSTRUCTION EXTENSOR HOOD RIGHT MIDDLE FINGER;  Surgeon: Wynonia Sours, MD;  Location: [place];  Service: Orthopedics;  Laterality: Right;  . TENDON REPAIR  1990   left arm  . TONSILLECTOMY    . UMBILICAL HERNIA REPAIR  2008  . UPPER GI ENDOSCOPY    . VOCAL CORD LATERALIZATION, ENDOSCOPIC APPROACH W/ MLB     polyp    Allergies  Allergen Reactions  . Galantamine     Other reaction(s): Dizziness (intolerance)  . Memantine     Other reaction(s): Other (See Comments) CONSTIPATION    Allergies as of 11/01/2019      Reactions   Galantamine    Other reaction(s): Dizziness (intolerance)   Memantine    Other reaction(s): Other (See Comments) CONSTIPATION      Medication List       Accurate as of November 01, 2019 11:59 PM. If you have any questions, ask your nurse or doctor.  acetaminophen 325 MG tablet Commonly known as: TYLENOL Take 650 mg by mouth in the morning, at noon, and at bedtime.   allopurinol 100 MG tablet Commonly known as: ZYLOPRIM Take 100 mg by mouth daily.   B COMPLEX PO Take by mouth daily.   finasteride 5 MG tablet Commonly known as: PROSCAR Take 5 mg by mouth daily.   iron polysaccharides 150 MG capsule Commonly known as: NIFEREX Take 150 mg by mouth daily.   lactose free nutrition Liqd Take 237 mLs by mouth daily.   melatonin 3 MG Tabs tablet Take 3 mg  by mouth at bedtime.   mineral oil-hydrophilic petrolatum ointment Apply topically daily.   nystatin cream Commonly known as: MYCOSTATIN Apply 1 application topically 2 (two) times daily.   QUEtiapine 25 MG tablet Commonly known as: SEROQUEL Take 12.5 mg by mouth daily.   sennosides-docusate sodium 8.6-50 MG tablet Commonly known as: SENOKOT-S Take 2 tablets by mouth daily.   sertraline 50 MG tablet Commonly known as: ZOLOFT Take 50 mg by mouth daily.   tamsulosin 0.4 MG Caps capsule Commonly known as: FLOMAX Take 0.4 mg by mouth daily.   torsemide 10 MG tablet Commonly known as: DEMADEX Take 10 mg by mouth daily.   torsemide 10 MG tablet Commonly known as: DEMADEX Take 20 mg by mouth daily.   Vitamin D 50 MCG (2000 UT) tablet Take 2,000 Units by mouth daily.   zinc oxide 20 % ointment Apply 1 application topically as needed for irritation.       Review of Systems  Constitutional: Negative for appetite change, fatigue and fever.  HENT: Positive for hearing loss. Negative for congestion and voice change.   Respiratory: Negative for cough and shortness of breath.   Cardiovascular: Positive for leg swelling. Negative for chest pain and palpitations.  Gastrointestinal: Negative for abdominal pain, constipation, nausea and vomiting.  Genitourinary: Positive for difficulty urinating. Negative for dysuria, frequency and hematuria.       Foley  Musculoskeletal: Positive for arthralgias, back pain and gait problem.       R knee pain  Skin: Positive for wound. Negative for color change.       Skin tears. Right tragus s/p biopsy site, redness, pus, warmth, tenderness.   Neurological: Negative for dizziness, speech difficulty, weakness and headaches.  Psychiatric/Behavioral: Positive for agitation, behavioral problems and sleep disturbance. Negative for hallucinations. The patient is nervous/anxious.        His mood is stabilizing.     Immunization History    Administered Date(s) Administered  . Influenza-Unspecified 10/19/2019  . PFIZER SARS-COV-2 Vaccination 01/30/2019, 02/20/2019  . Tdap 02/22/2019   Pertinent  Health Maintenance Due  Topic Date Due  . PNA vac Low Risk Adult (1 of 2 - PCV13) Never done  . INFLUENZA VACCINE  Completed   No flowsheet data found. Functional Status Survey:    Vitals:   11/01/19 1343  BP: 138/75  Pulse: 64  Resp: 20  Temp: 97.8 F (36.6 C)  SpO2: 98%  Weight: 178 lb (80.7 kg)  Height: 5' 8.5" (1.74 m)   Body mass index is 26.67 kg/m. Physical Exam Vitals and nursing note reviewed.  Constitutional:      Appearance: Normal appearance.  HENT:     Head: Normocephalic and atraumatic.  Eyes:     Extraocular Movements: Extraocular movements intact.     Conjunctiva/sclera: Conjunctivae normal.     Pupils: Pupils are equal, round, and reactive to light.  Cardiovascular:  Rate and Rhythm: Normal rate and regular rhythm.     Heart sounds: No murmur heard.   Pulmonary:     Effort: Pulmonary effort is normal.     Breath sounds: No rales.  Abdominal:     General: Bowel sounds are normal.     Palpations: Abdomen is soft.     Tenderness: There is no abdominal tenderness.  Genitourinary:    Comments: Foley Cath Musculoskeletal:        General: No swelling, tenderness or deformity.     Cervical back: Normal range of motion and neck supple.     Right lower leg: Edema present.     Left lower leg: Edema present.     Comments: Improved edema BLE 1+.  skin tears left shoulder, left forearm, left elbow. Bruise left lower back. Ecchymoses BLE.  Skin:    General: Skin is warm and dry.     Findings: Bruising present.     Comments: No active bleeding or s/s of infection of the skin tears left wrist x2, right arm skin tear. Multiple ecchymoses BLE. Right tragus s/p biopsy site, redness, pus, warmth, tenderness.    Neurological:     General: No focal deficit present.     Mental Status: He is alert.  Mental status is at baseline.     Motor: No weakness.     Coordination: Coordination normal.     Gait: Gait abnormal.     Comments: Oriented to person, place.   Psychiatric:        Mood and Affect: Mood normal.     Comments: Smiled, conversed appropriately during my examination today.      Labs reviewed: Recent Labs    02/22/19 0657 02/22/19 0657 07/11/19 0450 07/11/19 0450 07/11/19 0511 09/23/19 0000 11/05/19 0000 11/25/19 0000 11/29/19 0000  NA 130*   < > 139   < > 137   < > 136* 131* 130*  K 5.1   < > 4.0   < > 4.1   < > 4.2 5.0 5.0  CL 97*   < > 102   < > 103   < > 101 100 99  CO2 19*   < >  --   --  24   < > 23* 23* 24*  GLUCOSE 92  --  97  --  102*  --   --   --   --   BUN 41*   < > 37*   < > 38*   < > 57* 61* 64*  CREATININE 2.65*   < > 2.50*   < > 2.27*   < > 2.8* 2.8* 2.9*  CALCIUM 8.8*   < >  --   --  9.5   < > 8.9 8.0* 8.3*   < > = values in this interval not displayed.   Recent Labs    07/11/19 0511 10/27/19 0000  AST 26 20  ALT 24 23  ALKPHOS 64 76  BILITOT 0.5  --   PROT 6.8  --   ALBUMIN 3.4* 3.0*   Recent Labs    02/16/19 1116 02/22/19 0641 02/22/19 0657 07/11/19 0450 07/11/19 0511 09/16/19 0000 10/27/19 0000 10/27/19 0000 11/05/19 0000 11/29/19 0000 12/02/19 0000  WBC 6.4  --  6.0  --  6.9   < > 7.1   < > 7.9 8.7 7.3  NEUTROABS  --   --   --   --  4.4   < > 5,133.00   < >  323.00 6,551.00 4,993.00  HGB 10.2*   < > 10.8*   < > 10.8*   < > 8.0*   < > 9.0* 7.7* 7.5*  HCT 31.3*   < > 34.0*   < > 34.0*   < > 24*   < > 28* 24* 23*  MCV 102.0*  --  103.0*  --  101.8*  --   --   --   --   --   --   PLT 194  --  173  --  216   < > 211  --   --  290 281   < > = values in this interval not displayed.   Lab Results  Component Value Date   TSH 1.93 09/16/2019   No results found for: HGBA1C No results found for: CHOL, HDL, LDLCALC, LDLDIRECT, TRIG, CHOLHDL  Significant Diagnostic Results in last 30 days:  No results  found.  Assessment/Plan Skin infection R ear skin lesion, purulent drainage,  redness, warmth noted. Bactroban oint bid to affected area x 10 days.   Slow transit constipation Constipation, takes Senokot S, resolved clinically.     Anxiety and depression Anxiety/depressin, takes Sertraline, Quetiapine   Vascular dementia ([place]) Dementia, Namanda listed as allergy, AR is constipation. Glantamine AR is dizziness.   Frequent falls Frequent falls in the past,related to increased frailty, limited safety awareness   CKD (chronic kidney disease) stage 4, GFR 15-29 ml/min (HCC) CKD stage 4   A-fib (HCC) AFib, heart rate is in control   Edema of both lower extremities due to peripheral venous insufficiency Edema/PVD,chronic,takesTorsemide   HTN (hypertension) Blood pressure is controlled, takes Torsemide. .  Adynamic ileus ([place]) Resolved clinically.   BPH with obstruction/lower urinary tract symptoms BPH/Urinary frequency/urinary retention,  LUTS, takes Finasteride, Tamsulosin, Foley is in place for urinary retention.    Anemia, chronic disease  Anemia, Hgb 10.2 9/23/2, then dropped to 8, pending FOBT , takes Fe   Gout Gout, stable takes Allopurinol      Family/ staff Communication: plan of care reviewed with the patient and charge nurse  Labs/tests ordered:  None  Time spend 35 minutes.

## 2019-11-01 NOTE — Assessment & Plan Note (Signed)
Frequent falls in the past,related to increased frailty, limited safety awareness

## 2019-11-01 NOTE — Assessment & Plan Note (Signed)
CKD stage 4

## 2019-11-01 NOTE — Assessment & Plan Note (Signed)
Resolved clinically 

## 2019-11-01 NOTE — Assessment & Plan Note (Signed)
Blood pressure is controlled, takes Torsemide. Marland Kitchen

## 2019-11-01 NOTE — Assessment & Plan Note (Signed)
Anemia, Hgb 10.2 9/23/2, then dropped to 8, pending FOBT , takes Fe

## 2019-11-01 NOTE — Assessment & Plan Note (Signed)
Edema/PVD,chronic,takesTorsemide

## 2019-11-01 NOTE — Assessment & Plan Note (Signed)
Dementia, Namanda listed as allergy, AR is constipation. Glantamine AR is dizziness.

## 2019-11-01 NOTE — Assessment & Plan Note (Signed)
R ear skin lesion, purulent drainage,  redness, warmth noted. Bactroban oint bid to affected area x 10 days.

## 2019-11-01 NOTE — Assessment & Plan Note (Signed)
Constipation, takes Senokot S, resolved clinically.

## 2019-11-01 NOTE — Assessment & Plan Note (Signed)
AFib, heart rate is in control

## 2019-11-01 NOTE — Assessment & Plan Note (Signed)
Anxiety/depressin, takes Sertraline, Quetiapine

## 2019-11-01 NOTE — Assessment & Plan Note (Signed)
Gout, stable takes Allopurinol

## 2019-11-02 ENCOUNTER — Encounter: Payer: Self-pay | Admitting: Nurse Practitioner

## 2019-11-05 LAB — CBC AND DIFFERENTIAL
HCT: 28 — AB (ref 41–53)
Hemoglobin: 9 — AB (ref 13.5–17.5)
Neutrophils Absolute: 323
WBC: 7.9

## 2019-11-05 LAB — BASIC METABOLIC PANEL
BUN: 57 — AB (ref 4–21)
CO2: 23 — AB (ref 13–22)
Chloride: 101 (ref 99–108)
Creatinine: 2.8 — AB (ref 0.6–1.3)
Glucose: 89
Potassium: 4.2 (ref 3.4–5.3)
Sodium: 136 — AB (ref 137–147)

## 2019-11-05 LAB — COMPREHENSIVE METABOLIC PANEL
Calcium: 8.9 (ref 8.7–10.7)
GFR calc Af Amer: 23
GFR calc non Af Amer: 20

## 2019-11-05 LAB — CBC: RBC: 2.85 — AB (ref 3.87–5.11)

## 2019-11-15 ENCOUNTER — Encounter: Payer: Self-pay | Admitting: Internal Medicine

## 2019-11-19 ENCOUNTER — Non-Acute Institutional Stay (SKILLED_NURSING_FACILITY): Payer: Medicare Other | Admitting: Nurse Practitioner

## 2019-11-19 ENCOUNTER — Encounter: Payer: Self-pay | Admitting: Nurse Practitioner

## 2019-11-19 DIAGNOSIS — I10 Essential (primary) hypertension: Secondary | ICD-10-CM

## 2019-11-19 DIAGNOSIS — I872 Venous insufficiency (chronic) (peripheral): Secondary | ICD-10-CM

## 2019-11-19 DIAGNOSIS — D638 Anemia in other chronic diseases classified elsewhere: Secondary | ICD-10-CM

## 2019-11-19 DIAGNOSIS — M109 Gout, unspecified: Secondary | ICD-10-CM

## 2019-11-19 DIAGNOSIS — R634 Abnormal weight loss: Secondary | ICD-10-CM

## 2019-11-19 DIAGNOSIS — N184 Chronic kidney disease, stage 4 (severe): Secondary | ICD-10-CM

## 2019-11-19 DIAGNOSIS — F0151 Vascular dementia with behavioral disturbance: Secondary | ICD-10-CM

## 2019-11-19 DIAGNOSIS — N138 Other obstructive and reflux uropathy: Secondary | ICD-10-CM

## 2019-11-19 DIAGNOSIS — I4891 Unspecified atrial fibrillation: Secondary | ICD-10-CM

## 2019-11-19 DIAGNOSIS — F01518 Vascular dementia, unspecified severity, with other behavioral disturbance: Secondary | ICD-10-CM

## 2019-11-19 DIAGNOSIS — F32A Depression, unspecified: Secondary | ICD-10-CM

## 2019-11-19 DIAGNOSIS — K5901 Slow transit constipation: Secondary | ICD-10-CM

## 2019-11-19 DIAGNOSIS — R296 Repeated falls: Secondary | ICD-10-CM

## 2019-11-19 DIAGNOSIS — N401 Enlarged prostate with lower urinary tract symptoms: Secondary | ICD-10-CM

## 2019-11-19 DIAGNOSIS — L989 Disorder of the skin and subcutaneous tissue, unspecified: Secondary | ICD-10-CM

## 2019-11-19 DIAGNOSIS — F419 Anxiety disorder, unspecified: Secondary | ICD-10-CM

## 2019-11-19 NOTE — Assessment & Plan Note (Signed)
Anemia, Hgb 10.2 9/23/2, then dropped to 8, then Hgb 9.0 11/04/19, akes Fe

## 2019-11-19 NOTE — Assessment & Plan Note (Signed)
Edema/PVD,chronic,takesTorsemide

## 2019-11-19 NOTE — Assessment & Plan Note (Signed)
Frequent falls in the past,related to increased frailty, limited safety awareness

## 2019-11-19 NOTE — Assessment & Plan Note (Signed)
Heart rate is in control.  

## 2019-11-19 NOTE — Assessment & Plan Note (Signed)
BPH/Urinary frequency/urinary retention,LUTS, takes Finasteride, Tamsulosin

## 2019-11-19 NOTE — Assessment & Plan Note (Signed)
Blood pressure is controlled. Takes Torsemide.

## 2019-11-19 NOTE — Assessment & Plan Note (Signed)
CKD stage 4

## 2019-11-19 NOTE — Assessment & Plan Note (Signed)
constipation, takes Senokot S

## 2019-11-19 NOTE — Assessment & Plan Note (Signed)
Dementia, Namanda listed as allergy, AR is constipation. Glantamine AR is dizziness.

## 2019-11-19 NOTE — Progress Notes (Signed)
Location:   SNF [facility] Room Number: 8-A Place of Service:  SNF (31) Provider: [facility] Kendarius Vigen NP  Mahlon Gammon, MD  Patient Care Team: Mahlon Gammon, MD as PCP - General (Internal Medicine)  Extended Emergency Contact Information Primary Emergency Contact: Bertram Millard Mobile Phone: [phone] Relation: Son Secondary Emergency Contact: remigio, mcmillon Mobile Phone: [phone] Relation: Daughter  Code Status:  DNR Goals of care: Advanced Directive information Advanced Directives 11/19/2019  Does Patient Have a Medical Advance Directive? Yes  Type of Advance Directive Out of facility DNR (pink MOST or yellow form);Living will  Does patient want to make changes to medical advance directive? No - Patient declined  Copy of Healthcare Power of Attorney in Chart? -  Would patient like information on creating a medical advance directive? -  Pre-existing out of facility DNR order (yellow form or pink MOST form) -     Chief Complaint  Patient presents with  . Medical Management of Chronic Issues    Routine [facility] SN[facility] visit    HPI:  Pt is a 86 y.o. male seen today for medical management of chronic diseases.    Anemia, Hgb 10.2 9/23/2, then dropped to 8, then Hgb 9.0 11/04/19, akes Fe Gout, stable takes Allopurinol  BPH/Urinary frequency/urinary retention,LUTS, takes Finasteride, Tamsulosin HTN takesTorsemide Edema/PVD,chronic,takesTorsemide AFib, heart rate is in control CKD stage 4 Frequent falls in the past,related to increased frailty, limited safety awareness Dementia, Namanda listed as allergy, AR is constipation. Glantamine AR is dizziness. Anxiety/depressin, takes Sertraline, Quetiapine Constipation, takes Senokot S  Skin cancer: R ear, pending additional tissue removal.     Past Medical History:  Diagnosis Date   . Aortic atherosclerosis (HCC)   . Arthritis   . Chronic kidney disease   . DVT (deep venous thrombosis) (HCC)    hx  . Gout   . Hypertension   . Low back pain   . Memory deficit   . PE (pulmonary embolism)    hx  . Shortness of breath   . Vitamin D deficiency   . Wears glasses    Past Surgical History:  Procedure Laterality Date  . BACK SURGERY  1990   lumb lam  . CHOLECYSTECTOMY  2008   lap choli with umb HR  . COLONOSCOPY    . REPAIR EXTENSOR TENDON Right 05/26/2012   Procedure: RECONSTRUCTION EXTENSOR HOOD RIGHT MIDDLE FINGER;  Surgeon: Nicki Reaper, MD;  Location: [facility];  Service: Orthopedics;  Laterality: Right;  . TENDON REPAIR  1990   left arm  . TONSILLECTOMY    . UMBILICAL HERNIA REPAIR  2008  . UPPER GI ENDOSCOPY    . VOCAL CORD LATERALIZATION, ENDOSCOPIC APPROACH W/ MLB     polyp    Allergies  Allergen Reactions  . Galantamine     Other reaction(s): Dizziness (intolerance)  . Memantine     Other reaction(s): Other (See Comments) CONSTIPATION    Allergies as of 11/19/2019      Reactions   Galantamine    Other reaction(s): Dizziness (intolerance)   Memantine    Other reaction(s): Other (See Comments) CONSTIPATION      Medication List       Accurate as of November 19, 2019 11:59 PM. If you have any questions, ask your nurse or doctor.        STOP taking these medications   lactose free nutrition Liqd Stopped by: Larhonda Dettloff X Fronia Depass, NP     TAKE these  medications   allopurinol 100 MG tablet Commonly known as: ZYLOPRIM Take 100 mg by mouth daily.   B COMPLEX PO Take by mouth daily.   finasteride 5 MG tablet Commonly known as: PROSCAR Take 5 mg by mouth daily.   iron polysaccharides 150 MG capsule Commonly known as: NIFEREX Take 150 mg by mouth daily.   melatonin 3 MG Tabs tablet Take 3 mg by mouth at bedtime.   mineral oil-hydrophilic petrolatum ointment Apply topically daily.   nystatin cream Commonly known as:  MYCOSTATIN Apply 1 application topically 2 (two) times daily.   QUEtiapine 25 MG tablet Commonly known as: SEROQUEL Take 12.5 mg by mouth daily.   sennosides-docusate sodium 8.6-50 MG tablet Commonly known as: SENOKOT-S Take 2 tablets by mouth daily.   sertraline 50 MG tablet Commonly known as: ZOLOFT Take 50 mg by mouth daily.   tamsulosin 0.4 MG Caps capsule Commonly known as: FLOMAX Take 0.4 mg by mouth daily.   torsemide 10 MG tablet Commonly known as: DEMADEX Take 10 mg by mouth daily.   torsemide 10 MG tablet Commonly known as: DEMADEX Take 20 mg by mouth daily.   Vitamin D 50 MCG (2000 UT) tablet Take 2,000 Units by mouth daily.   zinc oxide 20 % ointment Apply 1 application topically as needed for irritation.       Review of Systems  Constitutional: Positive for unexpected weight change. Negative for appetite change, fatigue and fever.       #18Ibs weight loss in the past 3 weeks.   HENT: Positive for hearing loss. Negative for congestion and voice change.   Respiratory: Negative for shortness of breath.   Cardiovascular: Positive for leg swelling. Negative for chest pain and palpitations.  Gastrointestinal: Negative for abdominal pain, constipation and vomiting.  Genitourinary:       Foley due to urinary retention  Musculoskeletal: Positive for arthralgias, back pain and gait problem.       R knee pain  Skin: Positive for wound. Negative for color change.       Right tragus s/p biopsy site, redness, warmth, tenderness.   Neurological: Negative for speech difficulty, weakness, light-headedness and headaches.  Psychiatric/Behavioral: Positive for agitation, behavioral problems and sleep disturbance. Negative for hallucinations. The patient is nervous/anxious.        His mood is stabilizing.     Immunization History  Administered Date(s) Administered  . Influenza-Unspecified 10/19/2019  . PFIZER SARS-COV-2 Vaccination 01/30/2019, 02/20/2019  . Tdap  02/22/2019   Pertinent  Health Maintenance Due  Topic Date Due  . PNA vac Low Risk Adult (1 of 2 - PCV13) Never done  . INFLUENZA VACCINE  Completed   No flowsheet data found. Functional Status Survey:    Vitals:   11/19/19 1612  BP: 134/78  Pulse: 85  Resp: 20  Temp: 98.5 F (36.9 C)  TempSrc: Oral  SpO2: 96%  Weight: 160 lb (72.6 kg)  Height: $Remove'5\' 8"'ubrIsrr$  (1.727 m)   Body mass index is 24.33 kg/m. Physical Exam Vitals and nursing note reviewed.  Constitutional:      Appearance: Normal appearance.  HENT:     Head: Normocephalic and atraumatic.     Mouth/Throat:     Mouth: Mucous membranes are moist.  Eyes:     Extraocular Movements: Extraocular movements intact.     Conjunctiva/sclera: Conjunctivae normal.     Pupils: Pupils are equal, round, and reactive to light.  Cardiovascular:     Rate and Rhythm: Normal rate and  regular rhythm.     Heart sounds: No murmur heard.   Pulmonary:     Effort: Pulmonary effort is normal.     Breath sounds: No rales.  Abdominal:     General: Bowel sounds are normal.     Palpations: Abdomen is soft.     Tenderness: There is no abdominal tenderness.  Genitourinary:    Comments: Foley Cath Musculoskeletal:     Cervical back: Normal range of motion and neck supple.     Right lower leg: Edema present.     Left lower leg: Edema present.     Comments: Improved edema BLE trace.   Skin:    General: Skin is warm and dry.     Findings: Lesion present.     Comments: The right tragus s/p biopsy site, redness,  warmth, sore, not healing.    Neurological:     General: No focal deficit present.     Mental Status: He is alert. Mental status is at baseline.     Gait: Gait abnormal.     Comments: Oriented to person, place.   Psychiatric:        Mood and Affect: Mood normal.     Comments: Smiled, conversed appropriately during my examination today.      Labs reviewed: Recent Labs    02/22/19 0657 02/22/19 0657 07/11/19 0450  07/11/19 0450 07/11/19 0511 07/11/19 0511 09/23/19 0000 10/27/19 0000 11/05/19 0000  NA 130*   < > 139   < > 137  --  138 137 136*  K 5.1   < > 4.0   < > 4.1   < > 4.2 4.2 4.2  CL 97*   < > 102   < > 103   < > 103 108 101  CO2 19*   < >  --   --  24   < > 26* 20 23*  GLUCOSE 92  --  97  --  102*  --   --   --   --   BUN 41*   < > 37*   < > 38*  --  43* 73* 57*  CREATININE 2.65*   < > 2.50*   < > 2.27*  --  2.6* 3.0* 2.8*  CALCIUM 8.8*   < >  --   --  9.5   < > 8.9 8.4* 8.9   < > = values in this interval not displayed.   Recent Labs    07/11/19 0511 10/27/19 0000  AST 26 20  ALT 24 23  ALKPHOS 64 76  BILITOT 0.5  --   PROT 6.8  --   ALBUMIN 3.4* 3.0*   Recent Labs    02/16/19 1116 02/22/19 0641 02/22/19 0657 07/11/19 0450 07/11/19 0511 09/16/19 0000 09/23/19 0000 09/23/19 0000 10/07/19 0000 10/27/19 0000 11/05/19 0000  WBC 6.4  --  6.0  --  6.9   < > 7.9   < > 9.3 7.1 7.9  NEUTROABS  --   --   --   --  4.4  --  5,175  --   --  5,133.00 323.00  HGB 10.2*   < > 10.8*   < > 10.8*   < > 9.1*   < > 10.2* 8.0* 9.0*  HCT 31.3*   < > 34.0*   < > 34.0*   < > 27*   < > 31* 24* 28*  MCV 102.0*  --  103.0*  --  101.8*  --   --   --   --   --   --  PLT 194  --  173  --  216   < > 243  --  297 211  --    < > = values in this interval not displayed.   Lab Results  Component Value Date   TSH 1.93 09/16/2019   No results found for: HGBA1C No results found for: CHOL, HDL, LDLCALC, LDLDIRECT, TRIG, CHOLHDL  Significant Diagnostic Results in last 30 days:  No results found.  Assessment/Plan A-fib (HCC) Heart rate is in control.   Edema of both lower extremities due to peripheral venous insufficiency Edema/PVD,chronic,takesTorsemide   HTN (hypertension) Blood pressure is controlled. Takes Torsemide.   Anemia, chronic disease Anemia, Hgb 10.2 9/23/2, then dropped to 8, then Hgb 9.0 11/04/19, akes Fe   Gout Stable, takes Allopurinol.   BPH with  obstruction/lower urinary tract symptoms BPH/Urinary frequency/urinary retention,LUTS, takes Finasteride, Tamsulosin   CKD (chronic kidney disease) stage 4, GFR 15-29 ml/min (HCC) CKD stage 4   Frequent falls Frequent falls in the past,related to increased frailty, limited safety awareness  Vascular dementia ([place]) Dementia, Namanda listed as allergy, AR is constipation. Glantamine AR is dizziness.  Anxiety and depression Anxiety/depressin, takes Sertraline, Quetiapine   Slow transit constipation constipation, takes Senokot S  Skin lesion Skin cancer: R ear, pending additional tissue removal.    Weight loss #18Ibs weight loss in the past 3 weeks. Weekly weight, update CBC/diff, CMP/eGFR, TSH   Family/ staff Communication: plan of care reviewed with the patient and charge nurse.   Labs/tests ordered: CBC/diff, CMP/eGFR, TSH  Time spend 35 minutes.

## 2019-11-19 NOTE — Assessment & Plan Note (Signed)
Anxiety/depressin, takes Sertraline, Quetiapine

## 2019-11-19 NOTE — Assessment & Plan Note (Signed)
Skin cancer: R ear, pending additional tissue removal.

## 2019-11-19 NOTE — Assessment & Plan Note (Signed)
Stable, takes Allopurinol.

## 2019-11-22 ENCOUNTER — Encounter: Payer: Self-pay | Admitting: Nurse Practitioner

## 2019-11-22 DIAGNOSIS — R634 Abnormal weight loss: Secondary | ICD-10-CM | POA: Insufficient documentation

## 2019-11-22 NOTE — Assessment & Plan Note (Signed)
#  18Ibs weight loss in the past 3 weeks. Weekly weight, update CBC/diff, CMP/eGFR, TSH

## 2019-11-25 ENCOUNTER — Encounter: Payer: Self-pay | Admitting: Internal Medicine

## 2019-11-25 ENCOUNTER — Non-Acute Institutional Stay (SKILLED_NURSING_FACILITY): Payer: Medicare Other | Admitting: Internal Medicine

## 2019-11-25 DIAGNOSIS — N138 Other obstructive and reflux uropathy: Secondary | ICD-10-CM

## 2019-11-25 DIAGNOSIS — F01518 Vascular dementia, unspecified severity, with other behavioral disturbance: Secondary | ICD-10-CM

## 2019-11-25 DIAGNOSIS — F0151 Vascular dementia with behavioral disturbance: Secondary | ICD-10-CM

## 2019-11-25 DIAGNOSIS — R296 Repeated falls: Secondary | ICD-10-CM

## 2019-11-25 DIAGNOSIS — N401 Enlarged prostate with lower urinary tract symptoms: Secondary | ICD-10-CM | POA: Diagnosis not present

## 2019-11-25 DIAGNOSIS — I872 Venous insufficiency (chronic) (peripheral): Secondary | ICD-10-CM | POA: Diagnosis not present

## 2019-11-25 DIAGNOSIS — R634 Abnormal weight loss: Secondary | ICD-10-CM

## 2019-11-25 DIAGNOSIS — N184 Chronic kidney disease, stage 4 (severe): Secondary | ICD-10-CM

## 2019-11-25 DIAGNOSIS — D638 Anemia in other chronic diseases classified elsewhere: Secondary | ICD-10-CM

## 2019-11-25 DIAGNOSIS — I4891 Unspecified atrial fibrillation: Secondary | ICD-10-CM

## 2019-11-25 LAB — BASIC METABOLIC PANEL
BUN: 61 — AB (ref 4–21)
CO2: 23 — AB (ref 13–22)
Chloride: 100 (ref 99–108)
Creatinine: 2.8 — AB (ref 0.6–1.3)
Glucose: 86
Potassium: 5 (ref 3.4–5.3)
Sodium: 131 — AB (ref 137–147)

## 2019-11-25 LAB — COMPREHENSIVE METABOLIC PANEL: Calcium: 8 — AB (ref 8.7–10.7)

## 2019-11-25 NOTE — Progress Notes (Addendum)
Location:    [place] Room Number: 8 Place of Service:  SNF [phone]) Provider:  Veleta Miners MD  Virgie Dad, MD  Patient Care Team: Virgie Dad, MD as PCP - General (Internal Medicine)  Extended Emergency Contact Information Primary Emergency Contact: Marko Stai Mobile Phone: [phone] Relation: Son Secondary Emergency Contact: aldyn, toon Mobile Phone: [phone] Relation: Daughter  Code Status:  DNR Goals of care: Advanced Directive information Advanced Directives 11/19/2019  Does Patient Have a Medical Advance Directive? Yes  Type of Advance Directive Out of facility DNR (pink MOST or yellow form);Living will  Does patient want to make changes to medical advance directive? No - Patient declined  Copy of [place] in Chart? -  Would patient like information on creating a medical advance directive? -  Pre-existing out of facility DNR order (yellow form or pink MOST form) -     Chief Complaint  Patient presents with  . Acute Visit    Weight loss     HPI:  Pt is a 86 y.o. male seen today for an acute visit for Follow up of his renal function and weight loss   Patient has h/o Stage 4 CKD secondary to Hypertension, with Left renal Atrophy, BPH with Urinary retention, And now Chronic Foley Recurrent PE and DVT Off Chronic Coumadin due to [place] Recurrent Falls Progressive dementia with Behavior Issues, LE edema, Gout, h/o Orthostatic  Patient had acute episode of urinary obstruction due to his BPH A Foley was placed and since then patient has done very well.  His renal function improved.  His edema is almost resolved. Per nurses patient has lost almost 20 pounds now since been placed on Demadex. Patient denies any cough or chest pain.  Lower extremity swelling is almost resolved Denies any dizziness.    Continues to have behavior issues especially in the evening and his wife come and visit he gets  very combative Appetite is good. Past Medical History:  Diagnosis Date  . Aortic atherosclerosis ([place])   . Arthritis   . Chronic kidney disease   . DVT (deep venous thrombosis) (HCC)    hx  . Gout   . Hypertension   . Low back pain   . Memory deficit   . PE (pulmonary embolism)    hx  . Shortness of breath   . Vitamin D deficiency   . Wears glasses    Past Surgical History:  Procedure Laterality Date  . BACK SURGERY  1990   lumb lam  . CHOLECYSTECTOMY  2008   lap choli with umb HR  . COLONOSCOPY    . REPAIR EXTENSOR TENDON Right 05/26/2012   Procedure: RECONSTRUCTION EXTENSOR HOOD RIGHT MIDDLE FINGER;  Surgeon: Wynonia Sours, MD;  Location: [facility];  Service: Orthopedics;  Laterality: Right;  . TENDON REPAIR  1990   left arm  . TONSILLECTOMY    . UMBILICAL HERNIA REPAIR  2008  . UPPER GI ENDOSCOPY    . VOCAL CORD LATERALIZATION, ENDOSCOPIC APPROACH W/ MLB     polyp    Allergies  Allergen Reactions  . Galantamine     Other reaction(s): Dizziness (intolerance)  . Memantine     Other reaction(s): Other (See Comments) CONSTIPATION    Allergies as of 11/25/2019      Reactions   Galantamine    Other reaction(s): Dizziness (intolerance)   Memantine    Other reaction(s): Other (See Comments) CONSTIPATION  Medication List       Accurate as of November 25, 2019 10:52 AM. If you have any questions, ask your nurse or doctor.        allopurinol 100 MG tablet Commonly known as: ZYLOPRIM Take 100 mg by mouth daily.   B COMPLEX PO Take by mouth daily.   finasteride 5 MG tablet Commonly known as: PROSCAR Take 5 mg by mouth daily.   iron polysaccharides 150 MG capsule Commonly known as: NIFEREX Take 150 mg by mouth daily.   loratadine 10 MG tablet Commonly known as: CLARITIN Take 10 mg by mouth daily as needed for allergies.   melatonin 3 MG Tabs tablet Take 3 mg by mouth at bedtime.   mineral oil-hydrophilic petrolatum  ointment Apply topically daily.   nystatin cream Commonly known as: MYCOSTATIN Apply 1 application topically 2 (two) times daily.   QUEtiapine 25 MG tablet Commonly known as: SEROQUEL Take 12.5 mg by mouth daily.   sennosides-docusate sodium 8.6-50 MG tablet Commonly known as: SENOKOT-S Take 2 tablets by mouth daily.   sertraline 50 MG tablet Commonly known as: ZOLOFT Take 50 mg by mouth daily.   tamsulosin 0.4 MG Caps capsule Commonly known as: FLOMAX Take 0.4 mg by mouth daily.   torsemide 10 MG tablet Commonly known as: DEMADEX Take 10 mg by mouth daily. What changed: Another medication with the same name was removed. Continue taking this medication, and follow the directions you see here. Changed by: Virgie Dad, MD   Vitamin D 50 MCG (2000 UT) tablet Take 2,000 Units by mouth daily.   zinc oxide 20 % ointment Apply 1 application topically as needed for irritation.       Review of Systems  Unable to perform ROS: Dementia    Immunization History  Administered Date(s) Administered  . Influenza-Unspecified 10/19/2019  . PFIZER SARS-COV-2 Vaccination 01/30/2019, 02/20/2019  . Tdap 02/22/2019   Pertinent  Health Maintenance Due  Topic Date Due  . PNA vac Low Risk Adult (1 of 2 - PCV13) Never done  . INFLUENZA VACCINE  Completed   No flowsheet data found. Functional Status Survey:    Vitals:   11/25/19 1048  BP: (!) 151/81  Pulse: 98  Resp: 20  Temp: 97.7 F (36.5 C)  SpO2: 94%  Weight: 150 lb (68 kg)  Height: 5' 8.5" (1.74 m)   Body mass index is 22.48 kg/m. Physical Exam Vitals reviewed.  Constitutional:      Appearance: Normal appearance.  HENT:     Head: Normocephalic.     Nose: Nose normal.     Mouth/Throat:     Mouth: Mucous membranes are moist.     Pharynx: Oropharynx is clear.  Eyes:     Pupils: Pupils are equal, round, and reactive to light.  Cardiovascular:     Rate and Rhythm: Normal rate and regular rhythm.     Pulses:  Normal pulses.  Pulmonary:     Effort: Pulmonary effort is normal.     Breath sounds: Normal breath sounds.  Abdominal:     General: Abdomen is flat. Bowel sounds are normal.     Palpations: Abdomen is soft.  Musculoskeletal:     Cervical back: Neck supple.     Comments: Chronic Venous Changes  Skin:    General: Skin is warm.  Neurological:     General: No focal deficit present.     Mental Status: He is alert.  Psychiatric:  Mood and Affect: Mood normal.        Thought Content: Thought content normal.     Labs reviewed: Recent Labs    02/22/19 0657 02/22/19 0657 07/11/19 0450 07/11/19 0450 07/11/19 0511 07/11/19 0511 09/23/19 0000 10/27/19 0000 11/05/19 0000  NA 130*   < > 139   < > 137  --  138 137 136*  K 5.1   < > 4.0   < > 4.1   < > 4.2 4.2 4.2  CL 97*   < > 102   < > 103   < > 103 108 101  CO2 19*   < >  --   --  24   < > 26* 20 23*  GLUCOSE 92  --  97  --  102*  --   --   --   --   BUN 41*   < > 37*   < > 38*  --  43* 73* 57*  CREATININE 2.65*   < > 2.50*   < > 2.27*  --  2.6* 3.0* 2.8*  CALCIUM 8.8*   < >  --   --  9.5   < > 8.9 8.4* 8.9   < > = values in this interval not displayed.   Recent Labs    07/11/19 0511 10/27/19 0000  AST 26 20  ALT 24 23  ALKPHOS 64 76  BILITOT 0.5  --   PROT 6.8  --   ALBUMIN 3.4* 3.0*   Recent Labs    02/16/19 1116 02/22/19 0641 02/22/19 0657 07/11/19 0450 07/11/19 0511 09/16/19 0000 09/23/19 0000 09/23/19 0000 10/07/19 0000 10/27/19 0000 11/05/19 0000  WBC 6.4  --  6.0  --  6.9   < > 7.9   < > 9.3 7.1 7.9  NEUTROABS  --   --   --   --  4.4  --  5,175  --   --  5,133.00 323.00  HGB 10.2*   < > 10.8*   < > 10.8*   < > 9.1*   < > 10.2* 8.0* 9.0*  HCT 31.3*   < > 34.0*   < > 34.0*   < > 27*   < > 31* 24* 28*  MCV 102.0*  --  103.0*  --  101.8*  --   --   --   --   --   --   PLT 194  --  173  --  216   < > 243  --  297 211  --    < > = values in this interval not displayed.   Lab Results  Component  Value Date   TSH 1.93 09/16/2019   No results found for: HGBA1C No results found for: CHOL, HDL, LDLCALC, LDLDIRECT, TRIG, CHOLHDL  Significant Diagnostic Results in last 30 days:  No results found.  Assessment/Plan Weight loss 178 few months ago to 150 now ? Fluid. He does look Euvolemic now Discontinue Demadex Start on Lasix 20 mg 3/week Repeat BMP Pending Dietary to follow Edema of both lower extremities due to peripheral venous insufficiency Resolved since Foley cathter  Anemia, chronic disease Due to CKD On iron Repeat CBC pending  BPH with obstruction/lower urinary tract symptoms Now Chronic Foley cathter Discontinue Proscar for now Just Flomax  CKD (chronic kidney disease) stage 4, GFR 15-29 ml/min (HCC) Repeat BMP pending Family has decided no aggressive work up He is Comfort care  Vascular dementia with behavior disturbance (  [place]) Have to still continue Seroquel and Zoloft for now Due to behavior in the evenings   Family/ staff Communication:   Labs/tests ordered:

## 2019-11-26 ENCOUNTER — Encounter: Payer: Self-pay | Admitting: Internal Medicine

## 2019-11-26 ENCOUNTER — Encounter: Payer: Self-pay | Admitting: Nurse Practitioner

## 2019-11-26 NOTE — Progress Notes (Signed)
This encounter was created in error - please disregard.

## 2019-11-29 LAB — CBC AND DIFFERENTIAL
HCT: 24 — AB (ref 41–53)
Hemoglobin: 7.7 — AB (ref 13.5–17.5)
Neutrophils Absolute: 6551
Platelets: 290 (ref 150–399)
WBC: 8.7

## 2019-11-29 LAB — BASIC METABOLIC PANEL
BUN: 64 — AB (ref 4–21)
CO2: 24 — AB (ref 13–22)
Chloride: 99 (ref 99–108)
Creatinine: 2.9 — AB (ref 0.6–1.3)
Glucose: 88
Potassium: 5 (ref 3.4–5.3)
Sodium: 130 — AB (ref 137–147)

## 2019-11-29 LAB — COMPREHENSIVE METABOLIC PANEL
Calcium: 8.3 — AB (ref 8.7–10.7)
GFR calc Af Amer: 21
GFR calc non Af Amer: 18

## 2019-11-29 LAB — CBC: RBC: 2.53 — AB (ref 3.87–5.11)

## 2019-11-30 ENCOUNTER — Encounter: Payer: Self-pay | Admitting: Nurse Practitioner

## 2019-11-30 ENCOUNTER — Non-Acute Institutional Stay (SKILLED_NURSING_FACILITY): Payer: Medicare Other | Admitting: Nurse Practitioner

## 2019-11-30 DIAGNOSIS — E871 Hypo-osmolality and hyponatremia: Secondary | ICD-10-CM

## 2019-11-30 DIAGNOSIS — N138 Other obstructive and reflux uropathy: Secondary | ICD-10-CM

## 2019-11-30 DIAGNOSIS — F0151 Vascular dementia with behavioral disturbance: Secondary | ICD-10-CM

## 2019-11-30 DIAGNOSIS — I4891 Unspecified atrial fibrillation: Secondary | ICD-10-CM

## 2019-11-30 DIAGNOSIS — I872 Venous insufficiency (chronic) (peripheral): Secondary | ICD-10-CM

## 2019-11-30 DIAGNOSIS — F32A Depression, unspecified: Secondary | ICD-10-CM

## 2019-11-30 DIAGNOSIS — R112 Nausea with vomiting, unspecified: Secondary | ICD-10-CM | POA: Diagnosis not present

## 2019-11-30 DIAGNOSIS — D638 Anemia in other chronic diseases classified elsewhere: Secondary | ICD-10-CM | POA: Diagnosis not present

## 2019-11-30 DIAGNOSIS — F419 Anxiety disorder, unspecified: Secondary | ICD-10-CM

## 2019-11-30 DIAGNOSIS — F01518 Vascular dementia, unspecified severity, with other behavioral disturbance: Secondary | ICD-10-CM

## 2019-11-30 DIAGNOSIS — R296 Repeated falls: Secondary | ICD-10-CM

## 2019-11-30 DIAGNOSIS — M109 Gout, unspecified: Secondary | ICD-10-CM

## 2019-11-30 DIAGNOSIS — I10 Essential (primary) hypertension: Secondary | ICD-10-CM

## 2019-11-30 DIAGNOSIS — R1110 Vomiting, unspecified: Secondary | ICD-10-CM | POA: Insufficient documentation

## 2019-11-30 DIAGNOSIS — N401 Enlarged prostate with lower urinary tract symptoms: Secondary | ICD-10-CM | POA: Diagnosis not present

## 2019-11-30 DIAGNOSIS — L989 Disorder of the skin and subcutaneous tissue, unspecified: Secondary | ICD-10-CM

## 2019-11-30 DIAGNOSIS — K5901 Slow transit constipation: Secondary | ICD-10-CM

## 2019-11-30 NOTE — Assessment & Plan Note (Signed)
Constipation, takes Senokot S

## 2019-11-30 NOTE — Assessment & Plan Note (Signed)
BPH/Urinary frequency/urinary retention,LUTS, takes Finasteride, Tamsulosin, Foley

## 2019-11-30 NOTE — Assessment & Plan Note (Signed)
Anemia, Hgb 10.2 9/23/2, then dropped to 8, then Hgb 9.0 11/04/19, now 7.7 11/29/19,  takes Fe, will add Omeprazole for GI protection in setting N/V, repeat CBC

## 2019-11-30 NOTE — Assessment & Plan Note (Signed)
Heart rate is in control.  

## 2019-11-30 NOTE — Assessment & Plan Note (Signed)
Frequent falls in the past,related to increased frailty, limited safety awareness

## 2019-11-30 NOTE — Assessment & Plan Note (Signed)
Dementia, [place] listed as allergy, AR is constipation. Glantamine AR is dizziness.

## 2019-11-30 NOTE — Assessment & Plan Note (Signed)
vomited x3 this morning, Zofran IM effective, last BM today, afebrile, ? VOVID booster vaccine yesterday. 11/29/19 wbc 8.7. observe.

## 2019-11-30 NOTE — Progress Notes (Signed)
Location:   SNF [place] Room Number: 8 Place of Service:  SNF (31) Provider: [facility] Alyona Romack NP  Virgie Dad, MD  Patient Care Team: Virgie Dad, MD as PCP - General (Internal Medicine)  Extended Emergency Contact Information Primary Emergency Contact: Marko Stai Mobile Phone: [phone] Relation: Son Secondary Emergency Contact: tavaughn, silguero Mobile Phone: [phone] Relation: Daughter  Code Status:  DNR Goals of care: Advanced Directive information Advanced Directives 11/19/2019  Does Patient Have a Medical Advance Directive? Yes  Type of Advance Directive Out of facility DNR (pink MOST or yellow form);Living will  Does patient want to make changes to medical advance directive? No - Patient declined  Copy of [place] in Chart? -  Would patient like information on creating a medical advance directive? -  Pre-existing out of facility DNR order (yellow form or pink MOST form) -     Chief Complaint  Patient presents with  . Acute Visit    Vomiting    HPI:  Pt is a 86 y.o. male seen today for an acute visit for vomited x3 this morning, Zofran IM effective, last BM today.   Anemia, Hgb 10.2 9/23/2, then dropped to 8, then Hgb 9.0 11/04/19, now 7.7 11/29/19,  takes Fe Gout, stable takes Allopurinol  BPH/Urinary frequency/urinary retention,LUTS, takes Finasteride, Tamsulosin, Foley HTN, off meds.  Edema/PVD,chronic,off Torsemide 11/25/19 due to weight loss and euvolemic state.  AFib, heart rate is in control CKD stage 4 Frequent falls in the past,related to increased frailty, limited safety awareness Dementia, Namanda listed as allergy, AR is constipation. Glantamine AR is dizziness. Anxiety/depressin, takes Sertraline, Quetiapine Constipation, takes Senokot S             Skin cancer: R ear, pending  additional tissue removal.      Past Medical History:  Diagnosis Date  . Aortic atherosclerosis ([place])   . Arthritis   . Chronic kidney disease   . DVT (deep venous thrombosis) (HCC)    hx  . Gout   . Hypertension   . Low back pain   . Memory deficit   . PE (pulmonary embolism)    hx  . Shortness of breath   . Vitamin D deficiency   . Wears glasses    Past Surgical History:  Procedure Laterality Date  . BACK SURGERY  1990   lumb lam  . CHOLECYSTECTOMY  2008   lap choli with umb HR  . COLONOSCOPY    . REPAIR EXTENSOR TENDON Right 05/26/2012   Procedure: RECONSTRUCTION EXTENSOR HOOD RIGHT MIDDLE FINGER;  Surgeon: Wynonia Sours, MD;  Location: [place];  Service: Orthopedics;  Laterality: Right;  . TENDON REPAIR  1990   left arm  . TONSILLECTOMY    . UMBILICAL HERNIA REPAIR  2008  . UPPER GI ENDOSCOPY    . VOCAL CORD LATERALIZATION, ENDOSCOPIC APPROACH W/ MLB     polyp    Allergies  Allergen Reactions  . Galantamine     Other reaction(s): Dizziness (intolerance)  . Memantine     Other reaction(s): Other (See Comments) CONSTIPATION    Allergies as of 11/30/2019      Reactions   Galantamine    Other reaction(s): Dizziness (intolerance)   Memantine    Other reaction(s): Other (See Comments) CONSTIPATION      Medication List       Accurate as of November 30, 2019 11:59 PM. If you have any questions, ask your nurse or doctor.  allopurinol 100 MG tablet Commonly known as: ZYLOPRIM Take 100 mg by mouth daily.   B COMPLEX PO Take by mouth daily.   furosemide 20 MG tablet Commonly known as: LASIX Take 20 mg by mouth 3 (three) times a week.   iron polysaccharides 150 MG capsule Commonly known as: NIFEREX Take 150 mg by mouth daily.   loratadine 10 MG tablet Commonly known as: CLARITIN Take 10 mg by mouth daily as needed for allergies.   melatonin 3 MG Tabs tablet Take 3 mg by mouth at bedtime.   mineral oil-hydrophilic  petrolatum ointment Apply topically daily.   nystatin cream Commonly known as: MYCOSTATIN Apply 1 application topically 2 (two) times daily.   promethazine 25 MG/ML injection Commonly known as: PHENERGAN Inject 25 mg into the vein once.   QUEtiapine 25 MG tablet Commonly known as: SEROQUEL Take 12.5 mg by mouth daily.   sennosides-docusate sodium 8.6-50 MG tablet Commonly known as: SENOKOT-S Take 2 tablets by mouth daily.   sertraline 50 MG tablet Commonly known as: ZOLOFT Take 50 mg by mouth daily.   tamsulosin 0.4 MG Caps capsule Commonly known as: FLOMAX Take 0.4 mg by mouth daily.   Vitamin D 50 MCG (2000 UT) tablet Take 2,000 Units by mouth daily.   zinc oxide 20 % ointment Apply 1 application topically as needed for irritation.       Review of Systems  Constitutional: Negative for appetite change, fatigue and fever.  HENT: Positive for hearing loss. Negative for congestion and voice change.   Respiratory: Negative for cough, shortness of breath and wheezing.   Cardiovascular: Positive for leg swelling. Negative for chest pain and palpitations.  Gastrointestinal: Positive for nausea and vomiting. Negative for abdominal distention, abdominal pain, constipation and diarrhea.  Genitourinary: Negative for hematuria.       Foley due to urinary retention  Musculoskeletal: Positive for arthralgias, back pain and gait problem.       R knee pain  Skin: Positive for wound. Negative for color change.       Right tragus s/p biopsy site, redness, warmth, tenderness.   Neurological: Negative for dizziness, facial asymmetry, speech difficulty, weakness and headaches.  Psychiatric/Behavioral: Positive for agitation, behavioral problems and sleep disturbance. Negative for hallucinations. The patient is nervous/anxious.        His mood is stabilizing.     Immunization History  Administered Date(s) Administered  . Influenza-Unspecified 10/19/2019  . PFIZER SARS-COV-2  Vaccination 01/30/2019, 02/20/2019  . Tdap 02/22/2019   Pertinent  Health Maintenance Due  Topic Date Due  . PNA vac Low Risk Adult (1 of 2 - PCV13) Never done  . INFLUENZA VACCINE  Completed   No flowsheet data found. Functional Status Survey:    Vitals:   11/30/19 1351  BP: (!) 151/81  Pulse: 98  Resp: 20  Temp: 97.9 F (36.6 C)  SpO2: 96%  Weight: 150 lb (68 kg)  Height: 5' 8.5" (1.74 m)   Body mass index is 22.48 kg/m. Physical Exam Vitals and nursing note reviewed.  Constitutional:      Appearance: Normal appearance.  HENT:     Head: Normocephalic and atraumatic.     Mouth/Throat:     Mouth: Mucous membranes are moist.  Eyes:     Extraocular Movements: Extraocular movements intact.     Conjunctiva/sclera: Conjunctivae normal.     Pupils: Pupils are equal, round, and reactive to light.  Cardiovascular:     Rate and Rhythm: Normal rate and regular  rhythm.     Heart sounds: No murmur heard.   Pulmonary:     Effort: Pulmonary effort is normal.     Breath sounds: No rales.  Abdominal:     General: Bowel sounds are normal. There is no distension.     Palpations: Abdomen is soft.     Tenderness: There is no abdominal tenderness. There is no right CVA tenderness, left CVA tenderness, guarding or rebound.  Genitourinary:    Comments: Foley Cath Musculoskeletal:     Cervical back: Normal range of motion and neck supple.     Right lower leg: Edema present.     Left lower leg: Edema present.     Comments: edema BLE trace.   Skin:    General: Skin is warm and dry.     Findings: Lesion present.     Comments: The right tragus s/p biopsy site, redness,  warmth, sore, not healing.    Neurological:     General: No focal deficit present.     Mental Status: He is alert. Mental status is at baseline.     Gait: Gait abnormal.     Comments: Oriented to person, place.   Psychiatric:        Mood and Affect: Mood normal.     Comments: Smiled, conversed appropriately  during my examination today.      Labs reviewed: Recent Labs    02/22/19 0657 02/22/19 0657 07/11/19 0450 07/11/19 0450 07/11/19 0511 09/23/19 0000 10/27/19 0000 11/05/19 0000 11/25/19 0000  NA 130*   < > 139   < > 137   < > 137 136* 131*  K 5.1   < > 4.0   < > 4.1   < > 4.2 4.2 5.0  CL 97*   < > 102   < > 103   < > 108 101 100  CO2 19*   < >  --   --  24   < > 20 23* 23*  GLUCOSE 92  --  97  --  102*  --   --   --   --   BUN 41*   < > 37*   < > 38*   < > 73* 57* 61*  CREATININE 2.65*   < > 2.50*   < > 2.27*   < > 3.0* 2.8* 2.8*  CALCIUM 8.8*   < >  --   --  9.5   < > 8.4* 8.9 8.0*   < > = values in this interval not displayed.   Recent Labs    07/11/19 0511 10/27/19 0000  AST 26 20  ALT 24 23  ALKPHOS 64 76  BILITOT 0.5  --   PROT 6.8  --   ALBUMIN 3.4* 3.0*   Recent Labs    02/16/19 1116 02/22/19 0641 02/22/19 0657 07/11/19 0450 07/11/19 0511 09/16/19 0000 09/23/19 0000 09/23/19 0000 10/07/19 0000 10/27/19 0000 11/05/19 0000  WBC 6.4  --  6.0  --  6.9   < > 7.9   < > 9.3 7.1 7.9  NEUTROABS  --   --   --   --  4.4  --  5,175  --   --  5,133.00 323.00  HGB 10.2*   < > 10.8*   < > 10.8*   < > 9.1*   < > 10.2* 8.0* 9.0*  HCT 31.3*   < > 34.0*   < > 34.0*   < > 27*   < >  31* 24* 28*  MCV 102.0*  --  103.0*  --  101.8*  --   --   --   --   --   --   PLT 194  --  173  --  216   < > 243  --  297 211  --    < > = values in this interval not displayed.   Lab Results  Component Value Date   TSH 1.93 09/16/2019   No results found for: HGBA1C No results found for: CHOL, HDL, LDLCALC, LDLDIRECT, TRIG, CHOLHDL  Significant Diagnostic Results in last 30 days:  No results found.  Assessment/Plan Vomiting  vomited x3 this morning, Zofran IM effective, last BM today, afebrile, ? VOVID booster vaccine yesterday. 11/29/19 wbc 8.7. observe.    Anemia, chronic disease Anemia, Hgb 10.2 9/23/2, then dropped to 8, then Hgb 9.0 11/04/19, now 7.7 11/29/19,  takes Fe,  will add Omeprazole for GI protection in setting N/V, repeat CBC  Gout Gout, stable takes Allopurinol    BPH with obstruction/lower urinary tract symptoms BPH/Urinary frequency/urinary retention,LUTS, takes Finasteride, Tamsulosin, Foley   HTN (hypertension) Controlled blood pressure, off meds.   Edema of both lower extremities due to peripheral venous insufficiency Edema/PVD,chronic,off Torsemide 11/25/19   A-fib ([place]) Heart rate is in control.   Frequent falls Frequent falls in the past,related to increased frailty, limited safety awareness  Vascular dementia ([place]) Dementia, Namanda listed as allergy, AR is constipation. Glantamine AR is dizziness.   Anxiety and depression Anxiety/depressin, takes Sertraline, Quetiapine   Slow transit constipation Constipation, takes Senokot S  Skin lesion Skin cancer: R ear, pending additional tissue removal.    Hyponatremia 11/25/19 Na 131, off Torsemide    Family/ staff Communication: plan of care reviewed with the patient and charge nurse.   Labs/tests ordered: CBC  Time spend 35 minutes

## 2019-11-30 NOTE — Assessment & Plan Note (Signed)
Anxiety/depressin, takes Sertraline, Quetiapine

## 2019-11-30 NOTE — Assessment & Plan Note (Signed)
Controlled blood pressure, off meds.  

## 2019-11-30 NOTE — Assessment & Plan Note (Signed)
Gout, stable takes Allopurinol

## 2019-11-30 NOTE — Assessment & Plan Note (Signed)
Skin cancer: R ear, pending additional tissue removal.

## 2019-11-30 NOTE — Assessment & Plan Note (Signed)
Edema/PVD,chronic,off Torsemide 11/25/19

## 2019-12-01 ENCOUNTER — Encounter: Payer: Self-pay | Admitting: Nurse Practitioner

## 2019-12-01 DIAGNOSIS — E871 Hypo-osmolality and hyponatremia: Secondary | ICD-10-CM | POA: Insufficient documentation

## 2019-12-01 NOTE — Assessment & Plan Note (Signed)
11/25/19 Na 131, off Torsemide

## 2019-12-02 ENCOUNTER — Encounter: Payer: Self-pay | Admitting: Internal Medicine

## 2019-12-02 ENCOUNTER — Non-Acute Institutional Stay (SKILLED_NURSING_FACILITY): Payer: Medicare Other | Admitting: Internal Medicine

## 2019-12-02 DIAGNOSIS — F01518 Vascular dementia, unspecified severity, with other behavioral disturbance: Secondary | ICD-10-CM

## 2019-12-02 DIAGNOSIS — N138 Other obstructive and reflux uropathy: Secondary | ICD-10-CM

## 2019-12-02 DIAGNOSIS — D649 Anemia, unspecified: Secondary | ICD-10-CM

## 2019-12-02 DIAGNOSIS — N184 Chronic kidney disease, stage 4 (severe): Secondary | ICD-10-CM | POA: Diagnosis not present

## 2019-12-02 DIAGNOSIS — N401 Enlarged prostate with lower urinary tract symptoms: Secondary | ICD-10-CM

## 2019-12-02 DIAGNOSIS — F0151 Vascular dementia with behavioral disturbance: Secondary | ICD-10-CM | POA: Diagnosis not present

## 2019-12-02 DIAGNOSIS — I872 Venous insufficiency (chronic) (peripheral): Secondary | ICD-10-CM

## 2019-12-02 LAB — CBC AND DIFFERENTIAL
HCT: 23 — AB (ref 41–53)
Hemoglobin: 7.5 — AB (ref 13.5–17.5)
Neutrophils Absolute: 4993
Platelets: 281 (ref 150–399)
WBC: 7.3

## 2019-12-02 LAB — CBC: RBC: 2.47 — AB (ref 3.87–5.11)

## 2019-12-02 NOTE — Progress Notes (Signed)
Location: [place] Room Number: [place] of Service:  SNF [phone])  Provider: Veleta Miners, MD  Code Status: DNR Goals of Care:  Advanced Directives 12/02/2019  Does Patient Have a Medical Advance Directive? Yes  Type of Paramedic of [place];Living will;Out of facility DNR (pink MOST or yellow form)  Does patient want to make changes to medical advance directive? No - Patient declined  Copy of [place] in Chart? Yes - validated most recent copy scanned in chart (See row information)  Would patient like information on creating a medical advance directive? -  Pre-existing out of facility DNR order (yellow form or pink MOST form) Yellow form placed in chart (order not valid for inpatient use)     Chief Complaint  Patient presents with  . Acute Visit    Patient is seen for anemia    HPI: Patient is an 86 y.o. male seen today for an acute visit for worsening anemia  Patient has h/o Stage 4 CKD secondary to Hypertension, with Left renal Atrophy, BPH with Urinary retention, Now has Chronic Foley cathter Recurrent PE and DVT Off Chronic Coumadin due to [place] Recurrent Falls Progressive dementia with Behavior Issues, LE edema, Gout, h/o Orthostatic  Seen today to discuss patient's anemia. Patient's hemoglobin has been slowly going down from 10.2 and is now down to 7.5 today. He has had number of occult done which have been negative for any GI source.  He has had few episodes of bleeding from his Foley catheter and it gets pulled sometimes.  But no other source of bleeding Continues to be on iron Denies any shortness of breath dizziness or weakness but patient does have dementia and is difficult to get detailed history   Past Medical History:  Diagnosis Date  . Aortic atherosclerosis ([place])   . Arthritis   . Chronic kidney disease   . DVT (deep venous thrombosis) (HCC)    hx  . Gout   . Hypertension     . Low back pain   . Memory deficit   . PE (pulmonary embolism)    hx  . Shortness of breath   . Vitamin D deficiency   . Wears glasses     Past Surgical History:  Procedure Laterality Date  . BACK SURGERY  1990   lumb lam  . CHOLECYSTECTOMY  2008   lap choli with umb HR  . COLONOSCOPY    . REPAIR EXTENSOR TENDON Right 05/26/2012   Procedure: RECONSTRUCTION EXTENSOR HOOD RIGHT MIDDLE FINGER;  Surgeon: Wynonia Sours, MD;  Location: [place];  Service: Orthopedics;  Laterality: Right;  . TENDON REPAIR  1990   left arm  . TONSILLECTOMY    . UMBILICAL HERNIA REPAIR  2008  . UPPER GI ENDOSCOPY    . VOCAL CORD LATERALIZATION, ENDOSCOPIC APPROACH W/ MLB     polyp    Allergies  Allergen Reactions  . Galantamine     Other reaction(s): Dizziness (intolerance)  . Memantine     Other reaction(s): Other (See Comments) CONSTIPATION    Outpatient Encounter Medications as of 12/02/2019  Medication Sig  . allopurinol (ZYLOPRIM) 100 MG tablet Take 100 mg by mouth daily.  . B Complex Vitamins (B COMPLEX PO) Take by mouth daily.  . Cholecalciferol (VITAMIN D) 50 MCG (2000 UT) tablet Take 2,000 Units by mouth daily.  . furosemide (LASIX) 20 MG tablet Take 20 mg by mouth 3 (three) times  a week.  . iron polysaccharides (NIFEREX) 150 MG capsule Take 150 mg by mouth daily.  . melatonin 3 MG TABS tablet Take 3 mg by mouth at bedtime.  . mineral oil-hydrophilic petrolatum (AQUAPHOR) ointment Apply topically daily.  Marland Kitchen nystatin cream (MYCOSTATIN) Apply 1 application topically 2 (two) times daily.  Marland Kitchen omeprazole (PRILOSEC) 20 MG capsule Take 20 mg by mouth daily.  . QUEtiapine (SEROQUEL) 25 MG tablet Take 12.5 mg by mouth daily.   . sennosides-docusate sodium (SENOKOT-S) 8.6-50 MG tablet Take 2 tablets by mouth daily.  . sertraline (ZOLOFT) 50 MG tablet Take 50 mg by mouth daily.   . tamsulosin (FLOMAX) 0.4 MG CAPS capsule Take 0.4 mg by mouth daily.  Marland Kitchen zinc oxide 20 % ointment  Apply 1 application topically as needed for irritation.  Marland Kitchen loratadine (CLARITIN) 10 MG tablet Take 10 mg by mouth daily as needed for allergies.  . promethazine (PHENERGAN) 25 MG/ML injection Inject 25 mg into the vein once.   No facility-administered encounter medications on file as of 12/02/2019.    Review of Systems:  Review of Systems  Unable to perform ROS: Dementia    Health Maintenance  Topic Date Due  . PNA vac Low Risk Adult (1 of 2 - PCV13) Never done  . TETANUS/TDAP  02/21/2029  . INFLUENZA VACCINE  Completed  . COVID-19 Vaccine  Completed    Physical Exam: Vitals:   12/02/19 1554  BP: 137/81  Pulse: 71  Resp: 20  Temp: 98.2 F (36.8 C)  TempSrc: Oral  SpO2: 96%  Weight: 150 lb (68 kg)  Height: 5' 8.5" (1.74 m)   Body mass index is 22.48 kg/m. Physical Exam Vitals reviewed.  Constitutional:      Appearance: Normal appearance.  HENT:     Head: Normocephalic.     Nose: Nose normal.     Mouth/Throat:     Mouth: Mucous membranes are moist.     Pharynx: Oropharynx is clear.  Eyes:     Pupils: Pupils are equal, round, and reactive to light.  Cardiovascular:     Rate and Rhythm: Normal rate and regular rhythm.     Pulses: Normal pulses.     Heart sounds: Normal heart sounds.  Pulmonary:     Effort: Pulmonary effort is normal. No respiratory distress.     Breath sounds: Normal breath sounds. No wheezing.  Abdominal:     General: Abdomen is flat. Bowel sounds are normal. There is no distension.     Palpations: Abdomen is soft.     Tenderness: There is no abdominal tenderness.  Musculoskeletal:        General: No swelling.     Cervical back: Neck supple.  Skin:    General: Skin is warm and dry.  Neurological:     General: No focal deficit present.     Mental Status: He is alert.  Psychiatric:        Mood and Affect: Mood normal.        Thought Content: Thought content normal.     Labs reviewed: Basic Metabolic Panel: Recent Labs     02/22/19 0657 02/22/19 0657 07/11/19 0450 07/11/19 0450 07/11/19 0511 09/16/19 0000 09/23/19 0000 11/05/19 0000 11/25/19 0000 11/29/19 0000  NA 130*   < > 139   < > 137  --    < > 136* 131* 130*  K 5.1   < > 4.0   < > 4.1  --    < > 4.2  5.0 5.0  CL 97*   < > 102   < > 103  --    < > 101 100 99  CO2 19*   < >  --   --  24  --    < > 23* 23* 24*  GLUCOSE 92  --  97  --  102*  --   --   --   --   --   BUN 41*   < > 37*   < > 38*  --    < > 57* 61* 64*  CREATININE 2.65*   < > 2.50*   < > 2.27*  --    < > 2.8* 2.8* 2.9*  CALCIUM 8.8*   < >  --   --  9.5  --    < > 8.9 8.0* 8.3*  TSH  --   --   --   --   --  1.93  --   --   --   --    < > = values in this interval not displayed.   Liver Function Tests: Recent Labs    07/11/19 0511 10/27/19 0000  AST 26 20  ALT 24 23  ALKPHOS 64 76  BILITOT 0.5  --   PROT 6.8  --   ALBUMIN 3.4* 3.0*   No results for input(s): LIPASE, AMYLASE in the last 8760 hours. No results for input(s): AMMONIA in the last 8760 hours. CBC: Recent Labs    02/16/19 1116 02/22/19 0641 02/22/19 0657 07/11/19 0450 07/11/19 0511 09/16/19 0000 10/07/19 0000 10/07/19 0000 10/27/19 0000 11/05/19 0000 11/29/19 0000  WBC 6.4  --  6.0  --  6.9   < > 9.3   < > 7.1 7.9 8.7  NEUTROABS  --   --   --   --  4.4   < >  --   --  5,133.00 323.00 6,551.00  HGB 10.2*   < > 10.8*   < > 10.8*   < > 10.2*   < > 8.0* 9.0* 7.7*  HCT 31.3*   < > 34.0*   < > 34.0*   < > 31*   < > 24* 28* 24*  MCV 102.0*  --  103.0*  --  101.8*  --   --   --   --   --   --   PLT 194  --  173  --  216   < > 297  --  211  --  290   < > = values in this interval not displayed.   Lipid Panel: No results for input(s): CHOL, HDL, LDLCALC, TRIG, CHOLHDL, LDLDIRECT in the last 8760 hours. No results found for: HGBA1C  Procedures since last visit: No results found.  Assessment/Plan Anemia, ? Etiology ? Due to CKD stage 4 Has been occult negative Continues to be on iron Discussed with his  wife and the son in the room.  Can consider hematology and nephrology follow-up.  Possible EPO injections. Patient does have advanced dementia and the family does not want any aggressive work-up. Will repeat CBC in a week   CKD (chronic kidney disease) stage 4, GFR 15-29 ml/min (HCC) Family wants to hold off on seeing nephrology right now  BPH with obstruction/lower urinary tract symptoms Doing well  with Foley and Flomax  Vascular dementia with behavior disturbance ([place]) Continue on low-dose of Zoloft and Seroquel  Continues to have some behavior issues but doing better with supportive  care  Edema of both lower extremitiesy Much improved.  His Lasix dose was reduced recently. H/o PE Off coumadin due to recurent falls and anemia   Labs/tests ordered:  CBC in 1 week  Total time spent in this patient care encounter was  45_  minutes; greater than 50% of the visit spent counseling patient and staff, reviewing records , Labs and coordinating care for problems addressed at this encounter.

## 2019-12-06 ENCOUNTER — Encounter: Payer: Self-pay | Admitting: Nurse Practitioner

## 2019-12-06 ENCOUNTER — Non-Acute Institutional Stay (SKILLED_NURSING_FACILITY): Payer: Medicare Other | Admitting: Nurse Practitioner

## 2019-12-06 DIAGNOSIS — R296 Repeated falls: Secondary | ICD-10-CM

## 2019-12-06 DIAGNOSIS — I872 Venous insufficiency (chronic) (peripheral): Secondary | ICD-10-CM

## 2019-12-06 DIAGNOSIS — N401 Enlarged prostate with lower urinary tract symptoms: Secondary | ICD-10-CM

## 2019-12-06 DIAGNOSIS — N184 Chronic kidney disease, stage 4 (severe): Secondary | ICD-10-CM

## 2019-12-06 DIAGNOSIS — M109 Gout, unspecified: Secondary | ICD-10-CM | POA: Diagnosis not present

## 2019-12-06 DIAGNOSIS — F01518 Vascular dementia, unspecified severity, with other behavioral disturbance: Secondary | ICD-10-CM

## 2019-12-06 DIAGNOSIS — F419 Anxiety disorder, unspecified: Secondary | ICD-10-CM

## 2019-12-06 DIAGNOSIS — I4891 Unspecified atrial fibrillation: Secondary | ICD-10-CM

## 2019-12-06 DIAGNOSIS — L89152 Pressure ulcer of sacral region, stage 2: Secondary | ICD-10-CM

## 2019-12-06 DIAGNOSIS — D638 Anemia in other chronic diseases classified elsewhere: Secondary | ICD-10-CM

## 2019-12-06 DIAGNOSIS — L89300 Pressure ulcer of unspecified buttock, unstageable: Secondary | ICD-10-CM | POA: Insufficient documentation

## 2019-12-06 DIAGNOSIS — I10 Essential (primary) hypertension: Secondary | ICD-10-CM

## 2019-12-06 DIAGNOSIS — F32A Depression, unspecified: Secondary | ICD-10-CM

## 2019-12-06 DIAGNOSIS — F0151 Vascular dementia with behavioral disturbance: Secondary | ICD-10-CM

## 2019-12-06 DIAGNOSIS — N138 Other obstructive and reflux uropathy: Secondary | ICD-10-CM

## 2019-12-06 DIAGNOSIS — K5901 Slow transit constipation: Secondary | ICD-10-CM

## 2019-12-06 NOTE — Progress Notes (Signed)
Location:    [place] Room Number: 8 Place of Service:  SNF (31) Provider: Lennie Odor Lathon Adan NP  Virgie Dad, MD  Patient Care Team: Virgie Dad, MD as PCP - General (Internal Medicine)  Extended Emergency Contact Information Primary Emergency Contact: Marko Stai Mobile Phone: [phone] Relation: Son Secondary Emergency Contact: sheffield, hawker Mobile Phone: [phone] Relation: Daughter  Code Status:  DNR Goals of care: Advanced Directive information Advanced Directives 12/02/2019  Does Patient Have a Medical Advance Directive? -  Type of Advance Directive -  Does patient want to make changes to medical advance directive? -  Copy of [place] in Chart? Yes - validated most recent copy scanned in chart (See row information)  Would patient like information on creating a medical advance directive? -  Pre-existing out of facility DNR order (yellow form or pink MOST form) Pink MOST form placed in chart (order not valid for inpatient use)     Chief Complaint  Patient presents with   Acute Visit    Skin breakdown Coccyx    HPI:  Pt is a 86 y.o. male seen today for an acute visit for pressure ulcers in coccyx, R+L buttocks.     Anemia, Hgb 10.2 9/23/2, then dropped to 8,then Hgb 9.0 11/04/19, now 7.7 11/29/19, takes Fe, pending CBC one week since 12/02/19 Gout, stable takes Allopurinol  BPH/Urinary frequency/urinary retention,LUTS, takes Finasteride, Tamsulosin, Foley HTN, off meds.  Edema/PVD,chronic,off Torsemide 11/25/19 due to weight loss and euvolemic state.  AFib, heart rate is in control, Hx of PE, off anticoagulation due to fall/anemia.  CKD stage 4 Frequent falls in the past,related to increased frailty, limited safety awareness Dementia, Namanda listed as allergy, AR is constipation. Glantamine AR is  dizziness. Anxiety/depressin, takes Sertraline, Quetiapine Constipation, takes Senokot S Skin cancer: R ear, pending additional tissue removal.    Past Medical History:  Diagnosis Date   Aortic atherosclerosis (HCC)    Arthritis    Chronic kidney disease    DVT (deep venous thrombosis) (HCC)    hx   Gout    Hypertension    Low back pain    Memory deficit    PE (pulmonary embolism)    hx   Shortness of breath    Vitamin D deficiency    Wears glasses    Past Surgical History:  Procedure Laterality Date   BACK SURGERY  1990   lumb lam   CHOLECYSTECTOMY  2008   lap choli with umb HR   COLONOSCOPY     REPAIR EXTENSOR TENDON Right 05/26/2012   Procedure: RECONSTRUCTION EXTENSOR HOOD RIGHT MIDDLE FINGER;  Surgeon: Wynonia Sours, MD;  Location: [place];  Service: Orthopedics;  Laterality: Right;   TENDON REPAIR  1990   left arm   TONSILLECTOMY     UMBILICAL HERNIA REPAIR  2008   UPPER GI ENDOSCOPY     VOCAL CORD LATERALIZATION, ENDOSCOPIC APPROACH W/ MLB     polyp    Allergies  Allergen Reactions   Galantamine     Other reaction(s): Dizziness (intolerance)   Memantine     Other reaction(s): Other (See Comments) CONSTIPATION    Allergies as of 12/06/2019      Reactions   Galantamine    Other reaction(s): Dizziness (intolerance)   Memantine    Other reaction(s): Other (See Comments) CONSTIPATION      Medication List       Accurate as of December 06, 2019 11:59  PM. If you have any questions, ask your nurse or doctor.        allopurinol 100 MG tablet Commonly known as: ZYLOPRIM Take 100 mg by mouth daily.   B COMPLEX PO Take by mouth daily.   carbamide peroxide 6.5 % OTIC solution Commonly known as: DEBROX 5 drops. Identify cerumen impaction by direct visualization by use of otoscope. Place 5 drops of Debrox to affected ear QHS x3 nights, then irrigate with bulb syringe. If  NOT effective, notify MD. Irrigate on 4th night   furosemide 20 MG tablet Commonly known as: LASIX Take 20 mg by mouth 3 (three) times a week.   iron polysaccharides 150 MG capsule Commonly known as: NIFEREX Take 150 mg by mouth daily.   melatonin 3 MG Tabs tablet Take 3 mg by mouth at bedtime.   mineral oil-hydrophilic petrolatum ointment Apply topically daily.   nystatin cream Commonly known as: MYCOSTATIN Apply 1 application topically 2 (two) times daily.   omeprazole 20 MG capsule Commonly known as: PRILOSEC Take 20 mg by mouth daily.   QUEtiapine 25 MG tablet Commonly known as: SEROQUEL Take 12.5 mg by mouth daily.   sennosides-docusate sodium 8.6-50 MG tablet Commonly known as: SENOKOT-S Take 2 tablets by mouth daily.   sertraline 50 MG tablet Commonly known as: ZOLOFT Take 50 mg by mouth daily.   tamsulosin 0.4 MG Caps capsule Commonly known as: FLOMAX Take 0.4 mg by mouth daily.   Vitamin D 50 MCG (2000 UT) tablet Take 2,000 Units by mouth daily.   zinc oxide 20 % ointment Apply 1 application topically as needed for irritation.       Review of Systems  Constitutional: Negative for appetite change, fatigue and fever.  HENT: Positive for hearing loss. Negative for congestion and voice change.   Respiratory: Negative for cough and shortness of breath.   Cardiovascular: Positive for leg swelling. Negative for chest pain and palpitations.  Gastrointestinal: Negative for abdominal pain, constipation, nausea and vomiting.  Genitourinary: Negative for hematuria.       Foley due to urinary retention  Musculoskeletal: Positive for arthralgias, back pain and gait problem.       R knee pain  Skin: Positive for wound. Negative for color change.       Right tragus s/p biopsy site, redness, warmth, tenderness.   Neurological: Negative for speech difficulty, weakness, light-headedness and headaches.  Psychiatric/Behavioral: Positive for agitation, behavioral  problems and sleep disturbance. Negative for hallucinations. The patient is not nervous/anxious.        His mood is stabilizing.     Immunization History  Administered Date(s) Administered   Influenza-Unspecified 10/19/2019   PFIZER SARS-COV-2 Vaccination 01/30/2019, 02/20/2019   Tdap 02/22/2019   Pertinent  Health Maintenance Due  Topic Date Due   PNA vac Low Risk Adult (1 of 2 - PCV13) Never done   INFLUENZA VACCINE  Completed   No flowsheet data found. Functional Status Survey:    Vitals:   12/06/19 1531  BP: (!) 105/58  Pulse: 80  Resp: 20  Temp: 98.4 F (36.9 C)  SpO2: 97%  Weight: 150 lb (68 kg)  Height: 5' 8.5" (1.74 m)   Body mass index is 22.48 kg/m. Physical Exam Vitals and nursing note reviewed.  Constitutional:      Appearance: Normal appearance.  HENT:     Head: Normocephalic and atraumatic.     Mouth/Throat:     Mouth: Mucous membranes are moist.  Eyes:  Extraocular Movements: Extraocular movements intact.     Conjunctiva/sclera: Conjunctivae normal.     Pupils: Pupils are equal, round, and reactive to light.  Cardiovascular:     Rate and Rhythm: Normal rate and regular rhythm.     Heart sounds: No murmur heard.   Pulmonary:     Effort: Pulmonary effort is normal.     Breath sounds: No rales.  Abdominal:     General: Bowel sounds are normal.     Palpations: Abdomen is soft.     Tenderness: There is no abdominal tenderness.  Genitourinary:    Comments: Foley Cath Musculoskeletal:     Cervical back: Normal range of motion and neck supple.     Right lower leg: Edema present.     Left lower leg: Edema present.     Comments: edema BLE trace.   Skin:    General: Skin is warm and dry.     Findings: Lesion present.     Comments: black eschar covered around open areas in coccygeal, R+L buttocks, no odorous drainage,  Neurological:     General: No focal deficit present.     Mental Status: He is alert. Mental status is at baseline.      Gait: Gait abnormal.     Comments: Oriented to person, place.   Psychiatric:        Mood and Affect: Mood normal.     Comments: Smiled, conversed appropriately during my examination today.      Labs reviewed: Recent Labs    02/22/19 0657 02/22/19 0657 07/11/19 0450 07/11/19 0450 07/11/19 0511 09/23/19 0000 11/05/19 0000 11/25/19 0000 11/29/19 0000  NA 130*   < > 139   < > 137   < > 136* 131* 130*  K 5.1   < > 4.0   < > 4.1   < > 4.2 5.0 5.0  CL 97*   < > 102   < > 103   < > 101 100 99  CO2 19*   < >  --   --  24   < > 23* 23* 24*  GLUCOSE 92  --  97  --  102*  --   --   --   --   BUN 41*   < > 37*   < > 38*   < > 57* 61* 64*  CREATININE 2.65*   < > 2.50*   < > 2.27*   < > 2.8* 2.8* 2.9*  CALCIUM 8.8*   < >  --   --  9.5   < > 8.9 8.0* 8.3*   < > = values in this interval not displayed.   Recent Labs    07/11/19 0511 10/27/19 0000  AST 26 20  ALT 24 23  ALKPHOS 64 76  BILITOT 0.5  --   PROT 6.8  --   ALBUMIN 3.4* 3.0*   Recent Labs    02/16/19 1116 02/22/19 0641 02/22/19 0657 07/11/19 0450 07/11/19 0511 09/16/19 0000 10/27/19 0000 10/27/19 0000 11/05/19 0000 11/29/19 0000 12/02/19 0000  WBC 6.4  --  6.0  --  6.9   < > 7.1   < > 7.9 8.7 7.3  NEUTROABS  --   --   --   --  4.4   < > 5,133.00   < > 323.00 6,551.00 4,993.00  HGB 10.2*   < > 10.8*   < > 10.8*   < > 8.0*   < > 9.0* 7.7* 7.5*  HCT 31.3*   < > 34.0*   < > 34.0*   < > 24*   < > 28* 24* 23*  MCV 102.0*  --  103.0*  --  101.8*  --   --   --   --   --   --   PLT 194  --  173  --  216   < > 211  --   --  290 281   < > = values in this interval not displayed.   Lab Results  Component Value Date   TSH 1.93 09/16/2019   No results found for: HGBA1C No results found for: CHOL, HDL, LDLCALC, LDLDIRECT, TRIG, CHOLHDL  Significant Diagnostic Results in last 30 days:  No results found.  Assessment/Plan Decubitus ulcer of coccygeal region, stage 2 (HCC) black eschar covered around open areas in  coccygeal, R+L buttocks, no odorous drainage, will apply Hydrocolloid dressing q 3 days/prn, pressure reduction by remind/assist the patient with frequent re positioning.   Anemia, chronic disease Anemia, Hgb 10.2 9/23/2, then dropped to 8,then Hgb 9.0 11/04/19, now 7.7 11/29/19, takes Fe, pending CBC one week since 12/02/19   Gout out, stable takes Allopurinol   BPH with obstruction/lower urinary tract symptoms BPH/Urinary frequency/urinary retention,LUTS, takes Finasteride, Tamsulosin, Foley  HTN (hypertension) Controlled blood pressure.   Edema of both lower extremities due to peripheral venous insufficiency Minimal edema BLE. Edema/PVD,chronic,off Torsemide 11/25/19 due to weight loss and euvolemic state.    A-fib (HCC) AFib, heart rate is in control, Hx of PE, off anticoagulation due to fall/anemia.   CKD (chronic kidney disease) stage 4, GFR 15-29 ml/min (HCC) CKD stage 4   Frequent falls Frequent falls in the past,related to increased frailty, limited safety awareness  Vascular dementia ([place]) Dementia, Namanda listed as allergy, AR is constipation. Glantamine AR is dizziness.   Anxiety and depression Anxiety/depressin, takes Sertraline, Quetiapine  Slow transit constipation Constipation, takes Senokot S     Family/ staff Communication: plan of care reviewed with the patient and charge nurse.   Labs/tests ordered:  None  Time spend 35 minutes.

## 2019-12-06 NOTE — Assessment & Plan Note (Signed)
Controlled blood pressure.  

## 2019-12-06 NOTE — Assessment & Plan Note (Signed)
Anxiety/depressin, takes Sertraline, Quetiapine

## 2019-12-06 NOTE — Assessment & Plan Note (Signed)
BPH/Urinary frequency/urinary retention,LUTS, takes Finasteride, Tamsulosin, Foley

## 2019-12-06 NOTE — Assessment & Plan Note (Signed)
CKD stage 4

## 2019-12-06 NOTE — Assessment & Plan Note (Signed)
Anemia, Hgb 10.2 9/23/2, then dropped to 8,then Hgb 9.0 11/04/19, now 7.7 11/29/19, takes Fe, pending CBC one week since 12/02/19

## 2019-12-06 NOTE — Assessment & Plan Note (Signed)
black eschar covered around open areas in coccygeal, R+L buttocks, no odorous drainage, will apply Hydrocolloid dressing q 3 days/prn, pressure reduction by remind/assist the patient with frequent re positioning.

## 2019-12-06 NOTE — Assessment & Plan Note (Signed)
Minimal edema BLE. Edema/PVD,chronic,off Torsemide 11/25/19 due to weight loss and euvolemic state.

## 2019-12-06 NOTE — Assessment & Plan Note (Signed)
AFib, heart rate is in control, Hx of PE, off anticoagulation due to fall/anemia.

## 2019-12-06 NOTE — Assessment & Plan Note (Signed)
Frequent falls in the past,related to increased frailty, limited safety awareness

## 2019-12-06 NOTE — Assessment & Plan Note (Signed)
Dementia, Namanda listed as allergy, AR is constipation. Glantamine AR is dizziness.

## 2019-12-06 NOTE — Assessment & Plan Note (Signed)
out, stable takes Allopurinol

## 2019-12-06 NOTE — Assessment & Plan Note (Signed)
Constipation, takes Senokot S

## 2019-12-07 ENCOUNTER — Encounter: Payer: Self-pay | Admitting: Nurse Practitioner

## 2019-12-12 ENCOUNTER — Encounter: Payer: Self-pay | Admitting: Internal Medicine

## 2019-12-13 ENCOUNTER — Non-Acute Institutional Stay (SKILLED_NURSING_FACILITY): Payer: Medicare Other | Admitting: Nurse Practitioner

## 2019-12-13 DIAGNOSIS — F32A Depression, unspecified: Secondary | ICD-10-CM

## 2019-12-13 DIAGNOSIS — I10 Essential (primary) hypertension: Secondary | ICD-10-CM

## 2019-12-13 DIAGNOSIS — N401 Enlarged prostate with lower urinary tract symptoms: Secondary | ICD-10-CM

## 2019-12-13 DIAGNOSIS — I872 Venous insufficiency (chronic) (peripheral): Secondary | ICD-10-CM

## 2019-12-13 DIAGNOSIS — M109 Gout, unspecified: Secondary | ICD-10-CM | POA: Diagnosis not present

## 2019-12-13 DIAGNOSIS — D638 Anemia in other chronic diseases classified elsewhere: Secondary | ICD-10-CM

## 2019-12-13 DIAGNOSIS — R296 Repeated falls: Secondary | ICD-10-CM

## 2019-12-13 DIAGNOSIS — F0151 Vascular dementia with behavioral disturbance: Secondary | ICD-10-CM

## 2019-12-13 DIAGNOSIS — F01518 Vascular dementia, unspecified severity, with other behavioral disturbance: Secondary | ICD-10-CM

## 2019-12-13 DIAGNOSIS — L89300 Pressure ulcer of unspecified buttock, unstageable: Secondary | ICD-10-CM | POA: Diagnosis not present

## 2019-12-13 DIAGNOSIS — R627 Adult failure to thrive: Secondary | ICD-10-CM

## 2019-12-13 DIAGNOSIS — K5901 Slow transit constipation: Secondary | ICD-10-CM

## 2019-12-13 DIAGNOSIS — N184 Chronic kidney disease, stage 4 (severe): Secondary | ICD-10-CM

## 2019-12-13 DIAGNOSIS — N138 Other obstructive and reflux uropathy: Secondary | ICD-10-CM

## 2019-12-13 DIAGNOSIS — I4891 Unspecified atrial fibrillation: Secondary | ICD-10-CM

## 2019-12-13 DIAGNOSIS — F419 Anxiety disorder, unspecified: Secondary | ICD-10-CM

## 2019-12-13 DIAGNOSIS — L989 Disorder of the skin and subcutaneous tissue, unspecified: Secondary | ICD-10-CM

## 2019-12-13 NOTE — Assessment & Plan Note (Signed)
Poor oral intake, skin breakdown, HPOA: plan of care is comfort measures, desires Hospice referral, will have Morphine 5mg  q2h prn available to him.

## 2019-12-13 NOTE — Assessment & Plan Note (Signed)
Frequent falls in the past,related to increased frailty, limited safety awareness

## 2019-12-13 NOTE — Assessment & Plan Note (Addendum)
No swelling, offTorsemide

## 2019-12-13 NOTE — Assessment & Plan Note (Signed)
Dementia, Namanda listed as allergy, AR is constipation. Glantamine AR is dizziness.

## 2019-12-13 NOTE — Assessment & Plan Note (Signed)
Skin cancer: R ear, pending additional tissue removal.

## 2019-12-13 NOTE — Progress Notes (Signed)
Location:   SNF [place] Room Number: 6 Place of Service:  SNF (31) Provider: [facility] Ami Mally NP  Virgie Dad, MD  Patient Care Team: Virgie Dad, MD as PCP - General (Internal Medicine)  Extended Emergency Contact Information Primary Emergency Contact: Marko Stai Mobile Phone: [phone] Relation: Son Secondary Emergency Contact: [place], schroader Mobile Phone: [phone] Relation: Daughter  Code Status:  DNR Goals of care: Advanced Directive information Advanced Directives 12/02/2019  Does Patient Have a Medical Advance Directive? -  Type of Advance Directive -  Does patient want to make changes to medical advance directive? -  Copy of [place] in Chart? Yes - validated most recent copy scanned in chart (See row information)  Would patient like information on creating a medical advance directive? -  Pre-existing out of facility DNR order (yellow form or pink MOST form) Pink MOST form placed in chart (order not valid for inpatient use)     Chief Complaint  Patient presents with  . Acute Visit    worsened pressure ulcers    HPI:  Pt is a 86 y.o. male seen today for an acute visit for worsened sacral/coccyx pressure wound    Anemia, Hgb 10.2 9/23/2, then dropped to 8,then Hgb 9.0 11/04/19,now 7.7 11/29/19, 7.5 12/02/19, takes Fe, pending CBC one week since 12/02/19 Gout, stable takes Allopurinol  BPH/Urinary frequency/urinary retention,LUTS, takes Finasteride, Tamsulosin, Foley HTN, off meds. Edema/PVD,chronic,offTorsemide11/11/21 due to weight loss and euvolemic state. AFib, heart rate is in control, Hx of PE, off anticoagulation due to fall/anemia.  CKD stage 4 Frequent falls in the past,related to increased frailty, limited safety awareness Dementia, Namanda listed as allergy, AR is constipation. Glantamine AR is  dizziness. Anxiety/depressin, takes Sertraline, Quetiapine Constipation, takes Senokot S Skin cancer: R ear, pending additional tissue removal.     Past Medical History:  Diagnosis Date  . Aortic atherosclerosis ([place])   . Arthritis   . Chronic kidney disease   . DVT (deep venous thrombosis) (HCC)    hx  . Gout   . Hypertension   . Low back pain   . Memory deficit   . PE (pulmonary embolism)    hx  . Shortness of breath   . Vitamin D deficiency   . Wears glasses    Past Surgical History:  Procedure Laterality Date  . BACK SURGERY  1990   lumb lam  . CHOLECYSTECTOMY  2008   lap choli with umb HR  . COLONOSCOPY    . REPAIR EXTENSOR TENDON Right 05/26/2012   Procedure: RECONSTRUCTION EXTENSOR HOOD RIGHT MIDDLE FINGER;  Surgeon: Wynonia Sours, MD;  Location: [place];  Service: Orthopedics;  Laterality: Right;  . TENDON REPAIR  1990   left arm  . TONSILLECTOMY    . UMBILICAL HERNIA REPAIR  2008  . UPPER GI ENDOSCOPY    . VOCAL CORD LATERALIZATION, ENDOSCOPIC APPROACH W/ MLB     polyp    Allergies  Allergen Reactions  . Galantamine     Other reaction(s): Dizziness (intolerance)  . Memantine     Other reaction(s): Other (See Comments) CONSTIPATION    Allergies as of 12/13/2019      Reactions   Galantamine    Other reaction(s): Dizziness (intolerance)   Memantine    Other reaction(s): Other (See Comments) CONSTIPATION      Medication List       Accurate as of December 13, 2019 11:59 PM. If you have any questions, ask  your nurse or doctor.        allopurinol 100 MG tablet Commonly known as: ZYLOPRIM Take 100 mg by mouth daily.   B COMPLEX PO Take by mouth daily.   furosemide 20 MG tablet Commonly known as: LASIX Take 20 mg by mouth 3 (three) times a week.   iron polysaccharides 150 MG capsule Commonly known as: NIFEREX Take 150 mg by mouth daily.   melatonin 3 MG Tabs tablet Take 3 mg by  mouth at bedtime.   mineral oil-hydrophilic petrolatum ointment Apply topically daily.   nystatin cream Commonly known as: MYCOSTATIN Apply 1 application topically 2 (two) times daily.   omeprazole 20 MG capsule Commonly known as: PRILOSEC Take 20 mg by mouth daily.   QUEtiapine 25 MG tablet Commonly known as: SEROQUEL Take 12.5 mg by mouth daily.   sennosides-docusate sodium 8.6-50 MG tablet Commonly known as: SENOKOT-S Take 2 tablets by mouth daily.   sertraline 50 MG tablet Commonly known as: ZOLOFT Take 50 mg by mouth daily.   tamsulosin 0.4 MG Caps capsule Commonly known as: FLOMAX Take 0.4 mg by mouth daily.   Vitamin D 50 MCG (2000 UT) tablet Take 2,000 Units by mouth daily.   zinc oxide 20 % ointment Apply 1 application topically as needed for irritation.       Review of Systems  Constitutional: Negative for appetite change, fatigue and fever.  HENT: Positive for hearing loss. Negative for congestion and voice change.   Respiratory: Negative for cough and shortness of breath.   Cardiovascular: Negative for chest pain, palpitations and leg swelling.  Gastrointestinal: Negative for abdominal pain, constipation, nausea and vomiting.  Genitourinary: Negative for hematuria.       Foley due to urinary retention  Musculoskeletal: Positive for arthralgias, back pain and gait problem.       R knee pain  Skin: Positive for wound. Negative for color change.       Right tragus s/p biopsy site, redness, warmth, tenderness.   Neurological: Negative for speech difficulty, weakness, light-headedness and headaches.  Psychiatric/Behavioral: Positive for agitation, behavioral problems and sleep disturbance. Negative for hallucinations. The patient is not nervous/anxious.        His mood is stabilizing.     Immunization History  Administered Date(s) Administered  . Influenza-Unspecified 10/19/2019  . PFIZER SARS-COV-2 Vaccination 01/30/2019, 02/20/2019  . Tdap  02/22/2019   Pertinent  Health Maintenance Due  Topic Date Due  . PNA vac Low Risk Adult (1 of 2 - PCV13) Never done  . INFLUENZA VACCINE  Completed   No flowsheet data found. Functional Status Survey:    Vitals:   12/13/19 1012  BP: 120/78  Pulse: 80  Resp: 18  Temp: 98 F (36.7 C)  SpO2: 99%   There is no height or weight on file to calculate BMI. Physical Exam Vitals and nursing note reviewed.  Constitutional:      Appearance: Normal appearance.  HENT:     Head: Normocephalic and atraumatic.     Mouth/Throat:     Mouth: Mucous membranes are moist.  Eyes:     Extraocular Movements: Extraocular movements intact.     Conjunctiva/sclera: Conjunctivae normal.     Pupils: Pupils are equal, round, and reactive to light.  Cardiovascular:     Rate and Rhythm: Normal rate and regular rhythm.     Heart sounds: No murmur heard.   Pulmonary:     Effort: Pulmonary effort is normal.     Breath sounds:  No rales.  Abdominal:     General: Bowel sounds are normal.     Palpations: Abdomen is soft.     Tenderness: There is no abdominal tenderness.  Genitourinary:    Comments: Foley Cath Musculoskeletal:     Cervical back: Normal range of motion and neck supple.     Right lower leg: No edema.     Left lower leg: No edema.  Skin:    General: Skin is warm and dry.     Findings: Lesion present.     Comments: black eschar covered around open areas in coccygeal, R+L buttocks, no odorous drainage,  Neurological:     General: No focal deficit present.     Mental Status: He is alert. Mental status is at baseline.     Gait: Gait abnormal.     Comments: Oriented to person, place.   Psychiatric:        Mood and Affect: Mood normal.     Comments: Smiled, conversed appropriately during my examination today.      Labs reviewed: Recent Labs    02/22/19 0657 02/22/19 0657 07/11/19 0450 07/11/19 0450 07/11/19 0511 09/23/19 0000 11/05/19 0000 11/25/19 0000 11/29/19 0000  NA  130*   < > 139   < > 137   < > 136* 131* 130*  K 5.1   < > 4.0   < > 4.1   < > 4.2 5.0 5.0  CL 97*   < > 102   < > 103   < > 101 100 99  CO2 19*   < >  --   --  24   < > 23* 23* 24*  GLUCOSE 92  --  97  --  102*  --   --   --   --   BUN 41*   < > 37*   < > 38*   < > 57* 61* 64*  CREATININE 2.65*   < > 2.50*   < > 2.27*   < > 2.8* 2.8* 2.9*  CALCIUM 8.8*   < >  --   --  9.5   < > 8.9 8.0* 8.3*   < > = values in this interval not displayed.   Recent Labs    07/11/19 0511 10/27/19 0000  AST 26 20  ALT 24 23  ALKPHOS 64 76  BILITOT 0.5  --   PROT 6.8  --   ALBUMIN 3.4* 3.0*   Recent Labs    02/16/19 1116 02/22/19 0641 02/22/19 0657 07/11/19 0450 07/11/19 0511 09/16/19 0000 10/27/19 0000 10/27/19 0000 11/05/19 0000 11/29/19 0000 12/02/19 0000  WBC 6.4  --  6.0  --  6.9   < > 7.1   < > 7.9 8.7 7.3  NEUTROABS  --   --   --   --  4.4   < > 5,133.00   < > 323.00 6,551.00 4,993.00  HGB 10.2*   < > 10.8*   < > 10.8*   < > 8.0*   < > 9.0* 7.7* 7.5*  HCT 31.3*   < > 34.0*   < > 34.0*   < > 24*   < > 28* 24* 23*  MCV 102.0*  --  103.0*  --  101.8*  --   --   --   --   --   --   PLT 194  --  173  --  216   < > 211  --   --  290 281   < > = values in this interval not displayed.   Lab Results  Component Value Date   TSH 1.93 09/16/2019   No results found for: HGBA1C No results found for: CHOL, HDL, LDLCALC, LDLDIRECT, TRIG, CHOLHDL  Significant Diagnostic Results in last 30 days:  No results found.  Assessment/Plan Unstageable pressure ulcer of buttock (HCC) Sacra/coccyx, R+L buttocks pressure ulcers, larger, black eschar, small amount of yellow drainage, foul odor, will obtain X-ray 3 views of sacral/coccyx to r/o osteomyelitis. Apply Santyl oint daily, then saline gauze packing, then cover with foam dressing. 12/13/19 wbc 17.7, Hgb 7.4, neutrophils 84.4%, will start Doxy 100mg  bid x 2 2wks.  12/13/19 X-ray sacrum/coccyx no acute osseous abnormality.   Anemia, chronic  disease Anemia, Hgb 10.2 9/23/2, then dropped to 8,then Hgb 9.0 11/04/19,now 7.7 11/29/19, 7.5 12/02/19, 7.4 12/13/19. takes Fe   Gout Gout, stable takes Allopurinol   BPH with obstruction/lower urinary tract symptoms BPH/Urinary frequency/urinary retention,LUTS, takes Finasteride, Tamsulosin, Foley   HTN (hypertension) Controlled, off med.  Edema of both lower extremities due to peripheral venous insufficiency No swelling, offTorsemide  A-fib (HCC) AFib, heart rate is in control, Hx of PE, off anticoagulation due to fall/anemia.   CKD (chronic kidney disease) stage 4, GFR 15-29 ml/min (HCC) Stage 4  Frequent falls Frequent falls in the past,related to increased frailty, limited safety awareness  Vascular dementia ([place]) Dementia, Namanda listed as allergy, AR is constipation. Glantamine AR is dizziness.   Anxiety and depression Anxiety/depressin, takes Sertraline, Quetiapine  Slow transit constipation Constipation, takes Senokot S   Skin lesion Skin cancer: R ear, pending additional tissue removal.    Adult failure to thrive Poor oral intake, skin breakdown, HPOA: plan of care is comfort measures, desires Hospice referral, will have Morphine 5mg  q2h prn available to him.     Family/ staff Communication: plan of care reviewed with the patient and charge nurse.   Labs/tests ordered: X-ray sacral Coccyx 3 views.   Time spend 35 minutes.

## 2019-12-13 NOTE — Assessment & Plan Note (Signed)
Anxiety/depressin, takes Sertraline, Quetiapine

## 2019-12-13 NOTE — Assessment & Plan Note (Addendum)
Sacra/coccyx, R+L buttocks pressure ulcers, larger, black eschar, small amount of yellow drainage, foul odor, will obtain X-ray 3 views of sacral/coccyx to r/o osteomyelitis. Apply Santyl oint daily, then saline gauze packing, then cover with foam dressing. 12/13/19 wbc 17.7, Hgb 7.4, neutrophils 84.4%, will start Doxy 100mg  bid x 2 2wks.  12/13/19 X-ray sacrum/coccyx no acute osseous abnormality.

## 2019-12-13 NOTE — Assessment & Plan Note (Signed)
Constipation, takes Senokot S

## 2019-12-13 NOTE — Assessment & Plan Note (Addendum)
Anemia, Hgb 10.2 9/23/2, then dropped to 8,then Hgb 9.0 11/04/19,now 7.7 11/29/19, 7.5 12/02/19, 7.4 12/13/19. takes Fe

## 2019-12-13 NOTE — Assessment & Plan Note (Signed)
Stage 4. 

## 2019-12-13 NOTE — Assessment & Plan Note (Signed)
Controlled, off med.

## 2019-12-13 NOTE — Assessment & Plan Note (Signed)
AFib, heart rate is in control, Hx of PE, off anticoagulation due to fall/anemia.

## 2019-12-13 NOTE — Assessment & Plan Note (Signed)
BPH/Urinary frequency/urinary retention,LUTS, takes Finasteride, Tamsulosin, Foley

## 2019-12-13 NOTE — Telephone Encounter (Signed)
Message routed to [facility] team

## 2019-12-13 NOTE — Assessment & Plan Note (Signed)
Gout, stable takes Allopurinol

## 2019-12-14 ENCOUNTER — Encounter: Payer: Self-pay | Admitting: Nurse Practitioner

## 2019-12-16 ENCOUNTER — Non-Acute Institutional Stay (SKILLED_NURSING_FACILITY): Admitting: Internal Medicine

## 2019-12-16 ENCOUNTER — Encounter: Payer: Self-pay | Admitting: Internal Medicine

## 2019-12-16 DIAGNOSIS — D638 Anemia in other chronic diseases classified elsewhere: Secondary | ICD-10-CM

## 2019-12-16 DIAGNOSIS — N401 Enlarged prostate with lower urinary tract symptoms: Secondary | ICD-10-CM | POA: Diagnosis not present

## 2019-12-16 DIAGNOSIS — L89300 Pressure ulcer of unspecified buttock, unstageable: Secondary | ICD-10-CM

## 2019-12-16 DIAGNOSIS — Z515 Encounter for palliative care: Secondary | ICD-10-CM

## 2019-12-16 DIAGNOSIS — N138 Other obstructive and reflux uropathy: Secondary | ICD-10-CM

## 2019-12-16 NOTE — Progress Notes (Signed)
Location:   [facility] Room Number: 8 Place of Service:  SNF [phone]) Provider:  Veleta Miners, MD  Virgie Dad, MD  Patient Care Team: Virgie Dad, MD as PCP - General (Internal Medicine)  Extended Emergency Contact Information Primary Emergency Contact: Marko Stai Mobile Phone: [phone] Relation: Son Secondary Emergency Contact: jacaden, forbush Mobile Phone: [phone] Relation: Daughter  Code Status:  DNR Goals of care: Advanced Directive information Advanced Directives 12/16/2019  Does Patient Have a Medical Advance Directive? Yes  Type of Paramedic of [place];Out of facility DNR (pink MOST or yellow form);Living will  Does patient want to make changes to medical advance directive? No - Patient declined  Copy of [place] in Chart? Yes - validated most recent copy scanned in chart (See row information)  Would patient like information on creating a medical advance directive? -  Pre-existing out of facility DNR order (yellow form or pink MOST form) Yellow form placed in chart (order not valid for inpatient use);Pink MOST form placed in chart (order not valid for inpatient use)     Chief Complaint  Patient presents with  . Acute Visit    End of life care    HPI:  Pt is a 86 y.o. male seen today for an acute visit for End of life Care   Patient has h/o Stage 4 CKD secondary to Hypertension, with Left renal Atrophy, BPH with Urinary retention, Now has Chronic Foley cathter Recurrent PE and DVTOffChronic Coumadin due to [place] Recurrent Falls Progressive dementia with Behavior Issues, LE edema, Gout, h/o Orthostatic Anemia  Since last visitt patient to has developed unstageable pressure wound in his sacral area almost 10 to 8 cm covered with eschar discharge and odor He was started back on doxycycline by [facility] He has also developed to pressure wounds on his bilateral Heels     Patient has lost almost 10 pounds is in discomfort with pain continues to not eat very anxious. Family aware of decline is talking to hospice Looks very uncomfortable and anxious.   Past Medical History:  Diagnosis Date  . Aortic atherosclerosis ([place])   . Arthritis   . Chronic kidney disease   . DVT (deep venous thrombosis) ([facility])    hx  . Gout   . Hypertension   . Low back pain   . Memory deficit   . PE (pulmonary embolism)    hx  . Shortness of breath   . Vitamin D deficiency   . Wears glasses    Past Surgical History:  Procedure Laterality Date  . BACK SURGERY  1990   lumb lam  . CHOLECYSTECTOMY  2008   lap choli with umb HR  . COLONOSCOPY    . REPAIR EXTENSOR TENDON Right 05/26/2012   Procedure: RECONSTRUCTION EXTENSOR HOOD RIGHT MIDDLE FINGER;  Surgeon: Wynonia Sours, MD;  Location: [facility];  Service: Orthopedics;  Laterality: Right;  . TENDON REPAIR  1990   left arm  . TONSILLECTOMY    . UMBILICAL HERNIA REPAIR  2008  . UPPER GI ENDOSCOPY    . VOCAL CORD LATERALIZATION, ENDOSCOPIC APPROACH W/ MLB     polyp    Allergies  Allergen Reactions  . Galantamine     Other reaction(s): Dizziness (intolerance)  . Memantine     Other reaction(s): Other (See Comments) CONSTIPATION    Allergies as of 12/16/2019      Reactions   Galantamine  Other reaction(s): Dizziness (intolerance)   Memantine    Other reaction(s): Other (See Comments) CONSTIPATION      Medication List       Accurate as of December 16, 2019  4:21 PM. If you have any questions, ask your nurse or doctor.        STOP taking these medications   B COMPLEX PO Stopped by: Virgie Dad, MD   furosemide 20 MG tablet Commonly known as: LASIX Stopped by: Virgie Dad, MD   Vitamin D 50 MCG (2000 UT) tablet Stopped by: Virgie Dad, MD     TAKE these medications   allopurinol 100 MG tablet Commonly known as: ZYLOPRIM Take 100 mg by mouth daily.   iron  polysaccharides 150 MG capsule Commonly known as: NIFEREX Take 150 mg by mouth daily.   LORazepam 0.5 MG tablet Commonly known as: ATIVAN Take 0.5 mg by mouth every 6 (six) hours as needed for anxiety.   melatonin 3 MG Tabs tablet Take 3 mg by mouth at bedtime.   mineral oil-hydrophilic petrolatum ointment Apply topically daily.   nystatin cream Commonly known as: MYCOSTATIN Apply 1 application topically 2 (two) times daily.   omeprazole 20 MG capsule Commonly known as: PRILOSEC Take 20 mg by mouth daily.   QUEtiapine 25 MG tablet Commonly known as: SEROQUEL Take 12.5 mg by mouth daily.   sennosides-docusate sodium 8.6-50 MG tablet Commonly known as: SENOKOT-S Take 2 tablets by mouth daily.   sertraline 50 MG tablet Commonly known as: ZOLOFT Take 50 mg by mouth daily.   tamsulosin 0.4 MG Caps capsule Commonly known as: FLOMAX Take 0.4 mg by mouth daily.   zinc oxide 20 % ointment Apply 1 application topically as needed for irritation.       Review of Systems  Unable to perform ROS: Dementia    Immunization History  Administered Date(s) Administered  . Influenza-Unspecified 10/19/2019  . PFIZER SARS-COV-2 Vaccination 01/30/2019, 02/20/2019  . Tdap 02/22/2019   Pertinent  Health Maintenance Due  Topic Date Due  . PNA vac Low Risk Adult (1 of 2 - PCV13) Never done  . INFLUENZA VACCINE  Completed   No flowsheet data found. Functional Status Survey:    Vitals:   12/16/19 1602  BP: (!) 113/57  Pulse: 80  Resp: 20  Temp: 98.2 F (36.8 C)  SpO2: 95%  Weight: 143 lb 8 oz (65.1 kg)  Height: 5' 8.5" (1.74 m)   Body mass index is 21.5 kg/m. Physical Exam Vitals reviewed.  Constitutional:      Appearance: Normal appearance.     Comments: Very confused and anxious  HENT:     Head: Normocephalic.     Nose: Nose normal.     Mouth/Throat:     Mouth: Mucous membranes are moist.     Pharynx: Oropharynx is clear.  Eyes:     Pupils: Pupils are equal,  round, and reactive to light.  Cardiovascular:     Rate and Rhythm: Normal rate and regular rhythm.     Pulses: Normal pulses.  Pulmonary:     Effort: Pulmonary effort is normal.     Breath sounds: Normal breath sounds.  Abdominal:     General: Abdomen is flat. Bowel sounds are normal.     Palpations: Abdomen is soft.  Musculoskeletal:        General: No swelling.     Cervical back: Neck supple.  Skin:    Comments: 10 x 8 cm Eschar covered  Pressure wound covered with Eschar and Odor Very uncomfortable Also has 2 Soft areas in both Heels most likely Developing Pressure wound  Neurological:     General: No focal deficit present.     Mental Status: He is alert.     Comments: Confused  Psychiatric:     Comments: Anxiety present     Labs reviewed: Recent Labs    02/22/19 0657 02/22/19 0657 07/11/19 0450 07/11/19 0450 07/11/19 0511 09/23/19 0000 11/05/19 0000 11/25/19 0000 11/29/19 0000  NA 130*   < > 139   < > 137   < > 136* 131* 130*  K 5.1   < > 4.0   < > 4.1   < > 4.2 5.0 5.0  CL 97*   < > 102   < > 103   < > 101 100 99  CO2 19*   < >  --   --  24   < > 23* 23* 24*  GLUCOSE 92  --  97  --  102*  --   --   --   --   BUN 41*   < > 37*   < > 38*   < > 57* 61* 64*  CREATININE 2.65*   < > 2.50*   < > 2.27*   < > 2.8* 2.8* 2.9*  CALCIUM 8.8*   < >  --   --  9.5   < > 8.9 8.0* 8.3*   < > = values in this interval not displayed.   Recent Labs    07/11/19 0511 10/27/19 0000  AST 26 20  ALT 24 23  ALKPHOS 64 76  BILITOT 0.5  --   PROT 6.8  --   ALBUMIN 3.4* 3.0*   Recent Labs    02/16/19 1116 02/22/19 0641 02/22/19 0657 07/11/19 0450 07/11/19 0511 09/16/19 0000 10/27/19 0000 10/27/19 0000 11/05/19 0000 11/29/19 0000 12/02/19 0000  WBC 6.4  --  6.0  --  6.9   < > 7.1   < > 7.9 8.7 7.3  NEUTROABS  --   --   --   --  4.4   < > 5,133.00   < > 323.00 6,551.00 4,993.00  HGB 10.2*   < > 10.8*   < > 10.8*   < > 8.0*   < > 9.0* 7.7* 7.5*  HCT 31.3*   < > 34.0*    < > 34.0*   < > 24*   < > 28* 24* 23*  MCV 102.0*  --  103.0*  --  101.8*  --   --   --   --   --   --   PLT 194  --  173  --  216   < > 211  --   --  290 281   < > = values in this interval not displayed.   Lab Results  Component Value Date   TSH 1.93 09/16/2019   No results found for: HGBA1C No results found for: CHOL, HDL, LDLCALC, LDLDIRECT, TRIG, CHOLHDL  Significant Diagnostic Results in last 30 days:  No results found.  Assessment/Plan End of life with Pressure wound Have ARF and Anemia At this time he is not taking anything PO Will control his pain and Anxiety with Scheduled Roxanol and Ativan Discontinue All PO meds including Doxycyline. D/w the Nurser.   Family/ staff Communication:   Labs/tests ordered:

## 2020-01-15 DEATH — deceased

## 2021-11-01 IMAGING — CT CT CERVICAL SPINE W/O CM
3 of 4 series · 13 of 33 positions shown, 16 images · non-contrast
Comparison: None.

CLINICAL DATA: Fall.  Headache.

EXAM:
CT HEAD WITHOUT CONTRAST
CT CERVICAL SPINE WITHOUT CONTRAST
TECHNIQUE: Multidetector CT imaging of the head and cervical spine was
performed following the standard protocol without intravenous
contrast. Multiplanar CT image reconstructions of the cervical spine
were also generated.

[Series 7: sag bone · sagittal · 0.45mm/px · 5 of 82 slices shown, 6 images]
[im 28/82  bone]
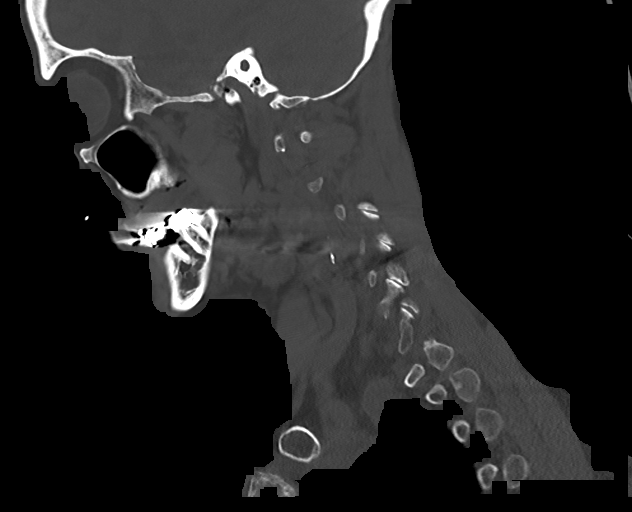
[im 34/82  bone]
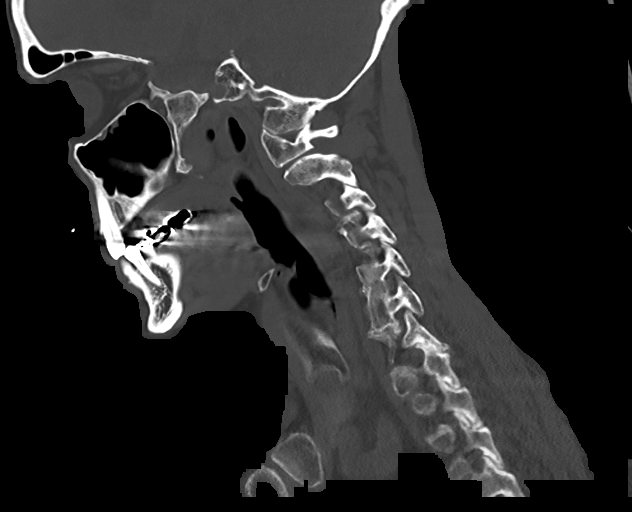
[im 41/82  soft-tissue]
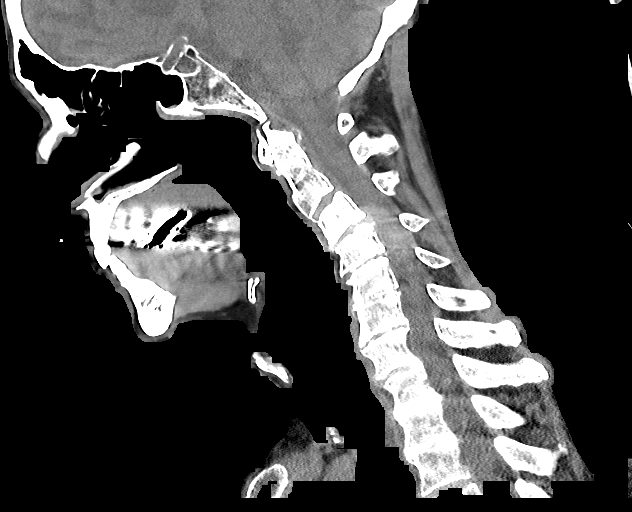
[im 41/82  bone]
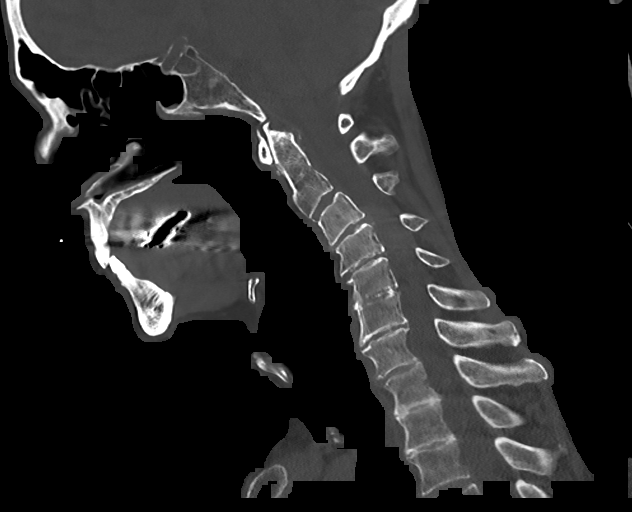
[im 48/82  bone]
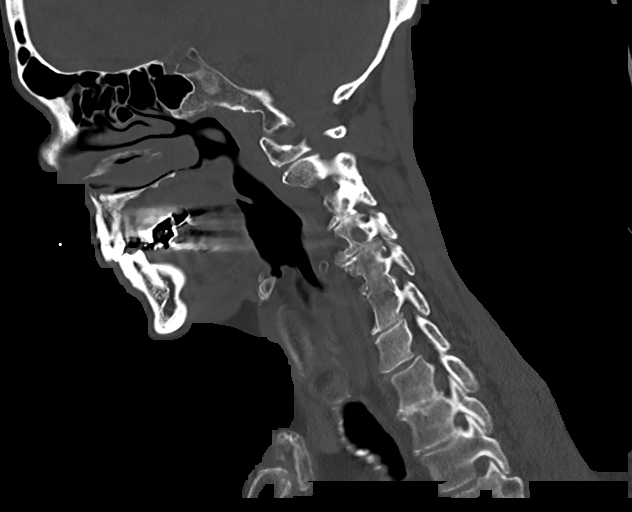
[im 55/82  bone]
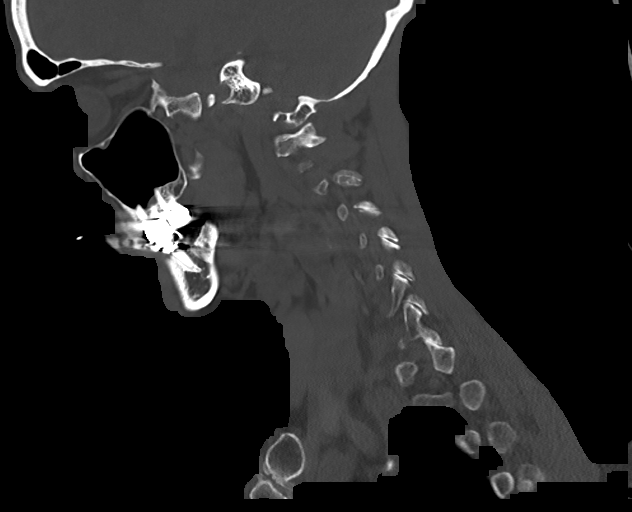

[Series 8: cor bone · coronal · 0.45mm/px · 3 of 143 slices shown]
[im 29/143  bone]
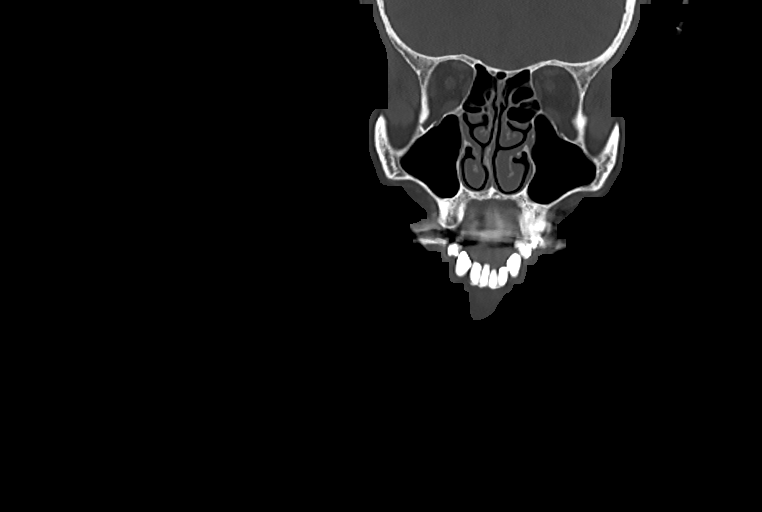
[im 57/143  bone]
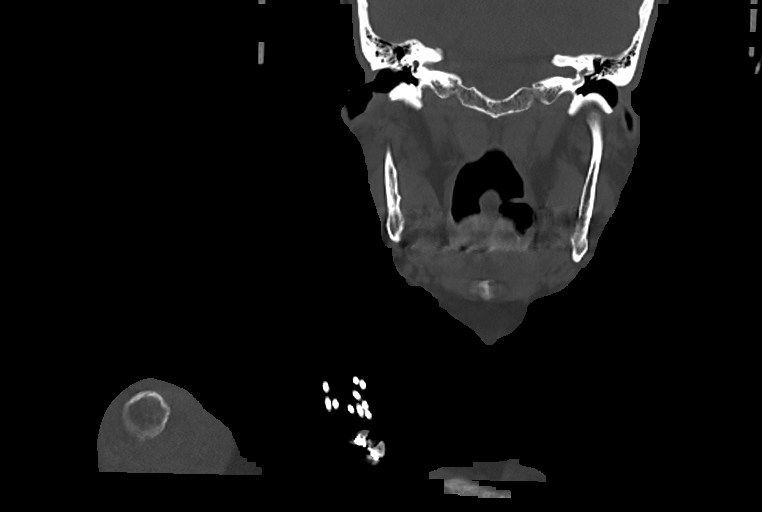
[im 86/143  bone]
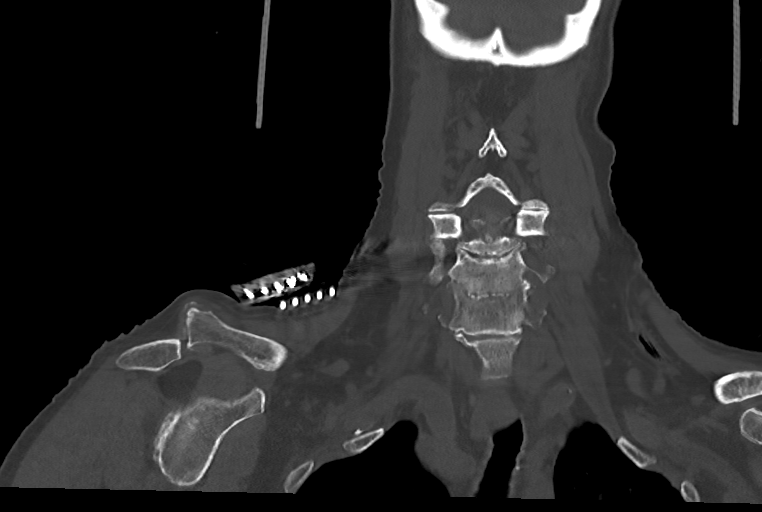

[Series 10: orthogonal axials · axial · 0.21mm/px · z∈[-268,-155]mm · 5 of 101 slices shown, 7 images]
[im 17/101  soft-tissue]
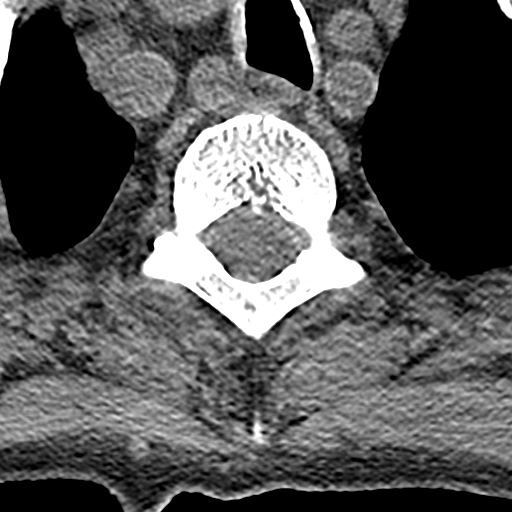
[im 17/101  bone]
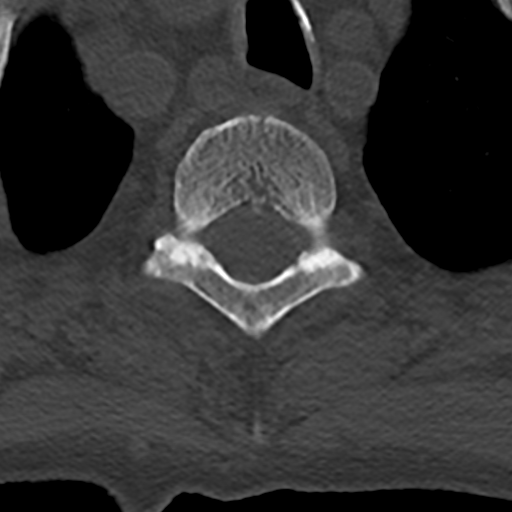
[im 34/101  bone]
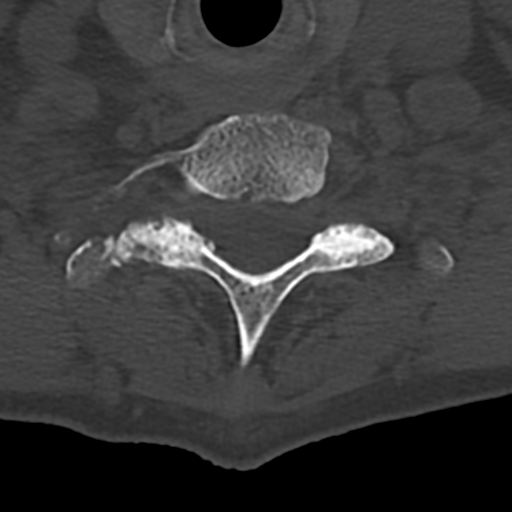
[im 51/101  bone]
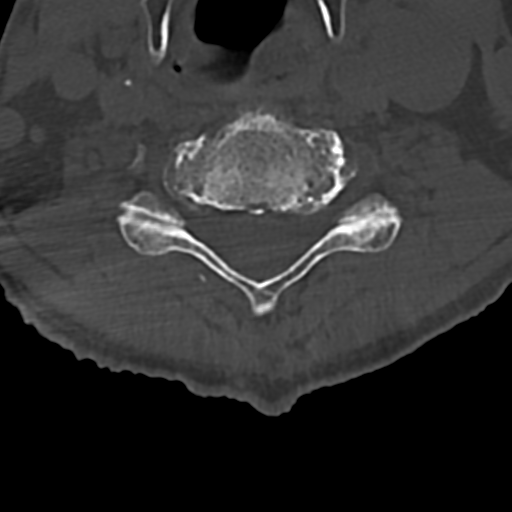
[im 67/101  bone]
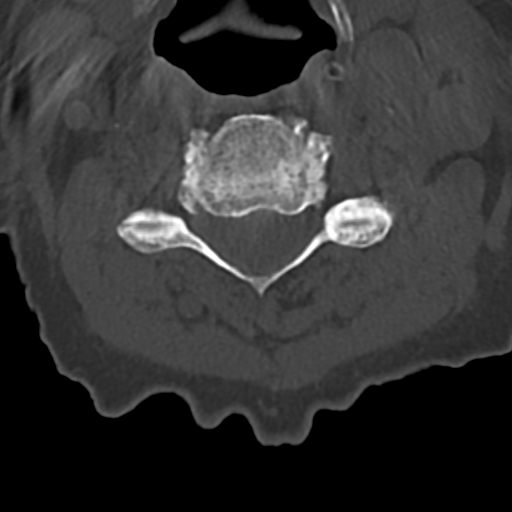
[im 84/101  soft-tissue]
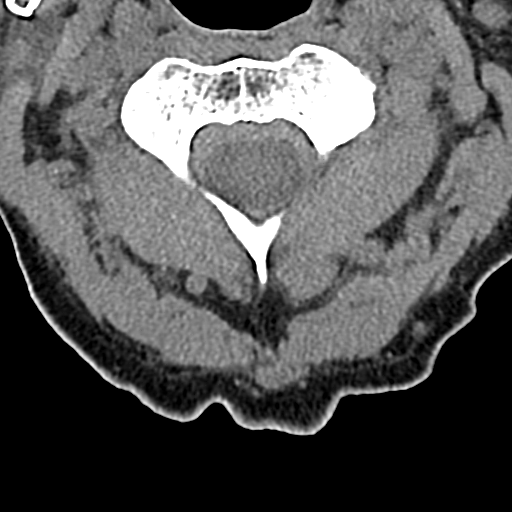
[im 84/101  bone]
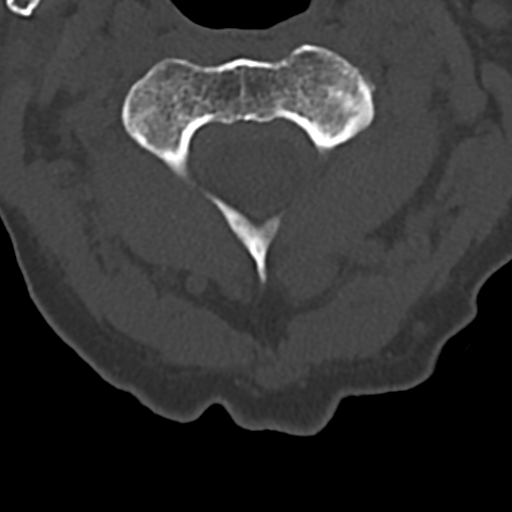

[13 of 33 positions shown; findings below may reference images not displayed]

FINDINGS: CT HEAD FINDINGS

Brain: There is no mass, hemorrhage or extra-axial collection. There
is generalized atrophy without lobar predilection. There is
hypoattenuation of the periventricular white matter, most commonly
indicating chronic ischemic microangiopathy.

Vascular: Atherosclerotic calcification of the vertebral and
internal carotid arteries at the skull base. No abnormal
hyperdensity of the major intracranial arteries or dural venous
sinuses.

Skull: The visualized skull base, calvarium and extracranial soft
tissues are normal.

Sinuses/Orbits: No fluid levels or advanced mucosal thickening of
the visualized paranasal sinuses. No mastoid or middle ear effusion.
The orbits are normal.

CT CERVICAL SPINE FINDINGS

Alignment: No static subluxation. Facets are aligned. Occipital
condyles are normally positioned. Reversal of normal cervical
lordosis may be positional or due to muscle spasm.

Skull base and vertebrae: No acute fracture.

Soft tissues and spinal canal: No prevertebral fluid or swelling. No
visible canal hematoma.

Disc levels: Moderate bilateral C3-6 neural foraminal stenosis.

Upper chest: No pneumothorax, pulmonary nodule or pleural effusion.

Other: Normal visualized paraspinal cervical soft tissues.
IMPRESSION: 1. Chronic ischemic microangiopathy and generalized atrophy without
acute intracranial abnormality.
2. No acute fracture or static subluxation of the cervical spine.
